# Patient Record
Sex: Female | Born: 1970 | Hispanic: No | Marital: Married | Smoking: Former smoker
Health system: Southern US, Community
[De-identification: ages and names within clinical notes are randomized; demographics above are authoritative.]

## PROBLEM LIST (undated history)

## (undated) DIAGNOSIS — E559 Vitamin D deficiency, unspecified: Secondary | ICD-10-CM

## (undated) DIAGNOSIS — E079 Disorder of thyroid, unspecified: Secondary | ICD-10-CM

## (undated) DIAGNOSIS — I10 Essential (primary) hypertension: Secondary | ICD-10-CM

## (undated) DIAGNOSIS — R7303 Prediabetes: Secondary | ICD-10-CM

## (undated) DIAGNOSIS — M1990 Unspecified osteoarthritis, unspecified site: Secondary | ICD-10-CM

## (undated) DIAGNOSIS — E7800 Pure hypercholesterolemia, unspecified: Secondary | ICD-10-CM

## (undated) DIAGNOSIS — M25569 Pain in unspecified knee: Secondary | ICD-10-CM

## (undated) DIAGNOSIS — E063 Autoimmune thyroiditis: Secondary | ICD-10-CM

## (undated) DIAGNOSIS — M2550 Pain in unspecified joint: Secondary | ICD-10-CM

## (undated) DIAGNOSIS — K589 Irritable bowel syndrome without diarrhea: Secondary | ICD-10-CM

## (undated) DIAGNOSIS — E538 Deficiency of other specified B group vitamins: Secondary | ICD-10-CM

## (undated) DIAGNOSIS — K5900 Constipation, unspecified: Secondary | ICD-10-CM

## (undated) DIAGNOSIS — T7840XA Allergy, unspecified, initial encounter: Secondary | ICD-10-CM

## (undated) DIAGNOSIS — F419 Anxiety disorder, unspecified: Secondary | ICD-10-CM

## (undated) HISTORY — DX: Anxiety disorder, unspecified: F41.9

## (undated) HISTORY — DX: Pain in unspecified joint: M25.50

## (undated) HISTORY — DX: Disorder of thyroid, unspecified: E07.9

## (undated) HISTORY — DX: Autoimmune thyroiditis: E06.3

## (undated) HISTORY — DX: Deficiency of other specified B group vitamins: E53.8

## (undated) HISTORY — DX: Allergy, unspecified, initial encounter: T78.40XA

## (undated) HISTORY — DX: Irritable bowel syndrome, unspecified: K58.9

## (undated) HISTORY — DX: Prediabetes: R73.03

## (undated) HISTORY — DX: Constipation, unspecified: K59.00

## (undated) HISTORY — DX: Pure hypercholesterolemia, unspecified: E78.00

## (undated) HISTORY — DX: Essential (primary) hypertension: I10

## (undated) HISTORY — DX: Pain in unspecified knee: M25.569

## (undated) HISTORY — PX: WRIST SURGERY: SHX841

## (undated) HISTORY — DX: Vitamin D deficiency, unspecified: E55.9

## (undated) HISTORY — DX: Unspecified osteoarthritis, unspecified site: M19.90

---

## 1998-07-19 ENCOUNTER — Other Ambulatory Visit: Admission: RE | Admit: 1998-07-19 | Discharge: 1998-07-19 | Payer: Self-pay | Admitting: *Deleted

## 1999-07-14 ENCOUNTER — Other Ambulatory Visit: Admission: RE | Admit: 1999-07-14 | Discharge: 1999-07-14 | Payer: Self-pay | Admitting: Obstetrics and Gynecology

## 2000-02-07 ENCOUNTER — Inpatient Hospital Stay (HOSPITAL_COMMUNITY): Admission: AD | Admit: 2000-02-07 | Discharge: 2000-02-10 | Payer: Self-pay | Admitting: *Deleted

## 2000-03-10 ENCOUNTER — Other Ambulatory Visit: Admission: RE | Admit: 2000-03-10 | Discharge: 2000-03-10 | Payer: Self-pay | Admitting: Obstetrics and Gynecology

## 2001-03-11 ENCOUNTER — Other Ambulatory Visit: Admission: RE | Admit: 2001-03-11 | Discharge: 2001-03-11 | Payer: Self-pay | Admitting: Obstetrics and Gynecology

## 2002-02-17 ENCOUNTER — Encounter: Admission: RE | Admit: 2002-02-17 | Discharge: 2002-02-17 | Payer: Self-pay | Admitting: Obstetrics and Gynecology

## 2002-02-17 ENCOUNTER — Encounter: Payer: Self-pay | Admitting: Obstetrics and Gynecology

## 2003-05-11 ENCOUNTER — Other Ambulatory Visit: Admission: RE | Admit: 2003-05-11 | Discharge: 2003-05-11 | Payer: Self-pay | Admitting: Obstetrics and Gynecology

## 2005-02-10 ENCOUNTER — Other Ambulatory Visit: Admission: RE | Admit: 2005-02-10 | Discharge: 2005-02-10 | Payer: Self-pay | Admitting: Obstetrics and Gynecology

## 2005-10-22 ENCOUNTER — Inpatient Hospital Stay (HOSPITAL_COMMUNITY): Admission: AD | Admit: 2005-10-22 | Discharge: 2005-10-25 | Payer: Self-pay | Admitting: Obstetrics and Gynecology

## 2006-04-22 ENCOUNTER — Ambulatory Visit: Payer: Self-pay | Admitting: Family Medicine

## 2006-04-22 LAB — CONVERTED CEMR LAB: TSH: 1.74 microintl units/mL (ref 0.35–5.50)

## 2006-06-04 ENCOUNTER — Ambulatory Visit: Payer: Self-pay | Admitting: Family Medicine

## 2006-06-18 ENCOUNTER — Ambulatory Visit: Payer: Self-pay | Admitting: Family Medicine

## 2006-08-22 DIAGNOSIS — E039 Hypothyroidism, unspecified: Secondary | ICD-10-CM | POA: Insufficient documentation

## 2006-09-09 ENCOUNTER — Ambulatory Visit: Payer: Self-pay | Admitting: Family Medicine

## 2006-10-08 ENCOUNTER — Ambulatory Visit: Payer: Self-pay | Admitting: Internal Medicine

## 2006-11-23 ENCOUNTER — Ambulatory Visit: Payer: Self-pay | Admitting: Internal Medicine

## 2006-11-26 ENCOUNTER — Ambulatory Visit: Payer: Self-pay | Admitting: Internal Medicine

## 2006-12-20 ENCOUNTER — Telehealth (INDEPENDENT_AMBULATORY_CARE_PROVIDER_SITE_OTHER): Payer: Self-pay | Admitting: *Deleted

## 2006-12-21 ENCOUNTER — Ambulatory Visit: Payer: Self-pay | Admitting: Internal Medicine

## 2006-12-22 LAB — CONVERTED CEMR LAB: TSH: 1.14 microintl units/mL (ref 0.35–5.50)

## 2006-12-23 ENCOUNTER — Telehealth: Payer: Self-pay | Admitting: Family Medicine

## 2007-03-28 ENCOUNTER — Ambulatory Visit: Payer: Self-pay | Admitting: Family Medicine

## 2007-05-11 ENCOUNTER — Telehealth (INDEPENDENT_AMBULATORY_CARE_PROVIDER_SITE_OTHER): Payer: Self-pay | Admitting: *Deleted

## 2007-10-06 ENCOUNTER — Telehealth (INDEPENDENT_AMBULATORY_CARE_PROVIDER_SITE_OTHER): Payer: Self-pay | Admitting: *Deleted

## 2007-11-18 ENCOUNTER — Telehealth (INDEPENDENT_AMBULATORY_CARE_PROVIDER_SITE_OTHER): Payer: Self-pay | Admitting: *Deleted

## 2008-07-12 ENCOUNTER — Other Ambulatory Visit: Admission: RE | Admit: 2008-07-12 | Discharge: 2008-07-12 | Payer: Self-pay | Admitting: Family Medicine

## 2010-07-27 ENCOUNTER — Encounter: Payer: Self-pay | Admitting: Family Medicine

## 2010-09-10 ENCOUNTER — Encounter: Payer: Self-pay | Admitting: Family Medicine

## 2010-09-18 ENCOUNTER — Other Ambulatory Visit: Payer: Self-pay | Admitting: Family Medicine

## 2010-09-18 ENCOUNTER — Encounter: Payer: Self-pay | Admitting: Family Medicine

## 2010-09-18 ENCOUNTER — Ambulatory Visit (INDEPENDENT_AMBULATORY_CARE_PROVIDER_SITE_OTHER): Payer: Managed Care, Other (non HMO) | Admitting: Family Medicine

## 2010-09-18 DIAGNOSIS — Z0000 Encounter for general adult medical examination without abnormal findings: Secondary | ICD-10-CM

## 2010-09-18 DIAGNOSIS — M25569 Pain in unspecified knee: Secondary | ICD-10-CM

## 2010-09-18 DIAGNOSIS — E785 Hyperlipidemia, unspecified: Secondary | ICD-10-CM

## 2010-09-18 DIAGNOSIS — E039 Hypothyroidism, unspecified: Secondary | ICD-10-CM

## 2010-09-18 LAB — CBC WITH DIFFERENTIAL/PLATELET
Basophils Absolute: 0 10*3/uL (ref 0.0–0.1)
Eosinophils Absolute: 0 10*3/uL (ref 0.0–0.7)
Eosinophils Relative: 1 % (ref 0.0–5.0)
HCT: 41.2 % (ref 36.0–46.0)
Hemoglobin: 14.1 g/dL (ref 12.0–15.0)
Lymphocytes Relative: 37.1 % (ref 12.0–46.0)
Lymphs Abs: 1.5 10*3/uL (ref 0.7–4.0)
MCHC: 34.1 g/dL (ref 30.0–36.0)
MCV: 96.6 fl (ref 78.0–100.0)
Monocytes Absolute: 0.4 10*3/uL (ref 0.1–1.0)
Neutro Abs: 2.1 10*3/uL (ref 1.4–7.7)
Neutrophils Relative %: 51.8 % (ref 43.0–77.0)
Platelets: 229 10*3/uL (ref 150.0–400.0)
RBC: 4.26 Mil/uL (ref 3.87–5.11)
RDW: 13 % (ref 11.5–14.6)

## 2010-09-18 LAB — BASIC METABOLIC PANEL
BUN: 11 mg/dL (ref 6–23)
CO2: 28 mEq/L (ref 19–32)
Calcium: 9.7 mg/dL (ref 8.4–10.5)
Chloride: 105 mEq/L (ref 96–112)
Creatinine, Ser: 0.7 mg/dL (ref 0.4–1.2)
GFR: 99.32 mL/min (ref >60.00)
Glucose, Bld: 75 mg/dL (ref 70–99)
Potassium: 4.7 mEq/L (ref 3.5–5.1)

## 2010-09-18 LAB — LDL CHOLESTEROL, DIRECT: Direct LDL: 137.8 mg/dL

## 2010-09-18 LAB — LIPID PANEL
Cholesterol: 220 mg/dL — ABNORMAL HIGH (ref 0–200)
Triglycerides: 54 mg/dL (ref 0.0–149.0)
VLDL: 10.8 mg/dL (ref 0.0–40.0)

## 2010-09-18 LAB — HEPATIC FUNCTION PANEL
AST: 20 U/L (ref 0–37)
Albumin: 4.4 g/dL (ref 3.5–5.2)
Total Bilirubin: 0.7 mg/dL (ref 0.3–1.2)

## 2010-09-18 LAB — TSH: TSH: 2.4 u[IU]/mL (ref 0.35–5.50)

## 2010-09-19 LAB — CONVERTED CEMR LAB: Vit D, 25-Hydroxy: 32 ng/mL (ref 30–89)

## 2010-09-23 ENCOUNTER — Ambulatory Visit: Payer: Managed Care, Other (non HMO) | Admitting: Family Medicine

## 2010-10-02 NOTE — Assessment & Plan Note (Signed)
Summary: re-establish/cbs   Vital Signs:  Patient profile:   40 year old female Weight:      164.13 pounds (74.60 kg) Temp:     98.5 degrees F (36.94 degrees C) oral BP sitting:   120 / 80  (left arm) Cuff size:   regular  Vitals Entered By: Lucious Groves CMA (September 18, 2010 10:22 AM) CC: Re-est care, wants CPX and pap./kb Is Patient Diabetic? No Pain Assessment Patient in pain? no      Comments Patient notes that she thinks her thyroid med needs adjusting.   History of Present Illness: 40 yo woman here today to re-establish care.  Previous MD- Tenny Craw [facility]).  hypothyroid- on Synthroid daily.  dx'd at age 89.  having some hair loss, increased fatigue, dry skin.  denies cold intolerance.  R knee pain- sxs started 8 months ago.  increased pain w/ stairs and exercise.  pain is in center of knee under knee cap.  no swelling.  no pain w/ regular walking or daily activities.  has not tried tylenol or ibuprofen.  health maintainence- overdue on pap.  Preventive Screening-Counseling & Management  Alcohol-Tobacco     Alcohol drinks/day: <1     Smoking Status: quit     Year Quit: 2009  Caffeine-Diet-Exercise     Does Patient Exercise: yes     Type of exercise: zumba      Sexual History:  currently monogamous.        Drug Use:  never.    Current Medications (verified): 1)  Synthroid 50 Mcg Tabs (Levothyroxine Sodium) .... Take 1 Tablet By Mouth Once A Day 2)  Microgestin Fe 1/20 1-20 Mg-Mcg Tabs (Norethin Ace-Eth Estrad-Fe) .... Once Daily  Allergies (verified): No Known Drug Allergies  Family History: Maternal GM- Ovarian CA Hyperlipidemia- mom DM- mom, grandfather HTN- mom  Social History: married 2 daughters (01, 89) works as Estate agent Status:  quit Does Patient Exercise:  yes Sexual History:  currently monogamous Drug Use:  never  Review of Systems      See HPI  Physical Exam  General:  Well-developed,well-nourished,in no acute  distress; alert,appropriate and cooperative throughout examination Head:  Normocephalic and atraumatic without obvious abnormalities. No apparent alopecia or balding. Eyes:  PERRL, EOMI Neck:  No deformities, masses, or tenderness noted. Lungs:  Normal respiratory effort, chest expands symmetrically. Lungs are clear to auscultation, no crackles or wheezes. Heart:  Normal rate and regular rhythm. S1 and S2 normal without gallop, murmur, click, rub or other extra sounds. Msk:  full flexion and extension of R knee, (-) patellar grind, no pain w/ internal or external rotation of knee Pulses:  +2 carotid, radial, DP Extremities:  No clubbing, cyanosis, edema, or deformity noted with normal full range of motion of all joints.   Neurologic:  alert & oriented X3, cranial nerves II-XII intact, strength normal in all extremities, sensation intact to light touch, gait normal, and DTRs symmetrical and normal.     Impression & Recommendations:  Problem # 1:  HYPOTHYROIDISM (ICD-244.9) due for labs.  adjust as needed. Her updated medication list for this problem includes:    Synthroid 50 Mcg Tabs (Levothyroxine sodium) .Marland Kitchen... Take 1 tablet by mouth once a day  Orders: Venipuncture (46962) Specimen Handling (95284) TLB-TSH (Thyroid Stimulating Hormone) (84443-TSH)  Problem # 2:  KNEE PAIN (XLK-440.10) Assessment: New suspect patellofemoral syndrome.  refer to sports med. Orders: Sports Medicine (Sports Med)  Problem # 3:  PHYSICAL EXAMINATION (ICD-V70.0) check labs  for upcoming physical. Orders: T-Vitamin D (25-Hydroxy) ([id number]) Specimen Handling (16010) TLB-Lipid Panel (80061-LIPID) TLB-BMP (Basic Metabolic Panel-BMET) (80048-METABOL) TLB-CBC Platelet - w/Differential (85025-CBCD) TLB-Hepatic/Liver Function Pnl (80076-HEPATIC)  Complete Medication List: 1)  Synthroid 50 Mcg Tabs (Levothyroxine sodium) .... Take 1 tablet by mouth once a day 2)  Microgestin Fe 1/20 1-20 Mg-mcg Tabs  (Norethin ace-eth estrad-fe) .... Once daily  Patient Instructions: 1)  Schedule your complete physical at your convenience- you can eat before this appt 2)  We'll notify you of your Sports Med appt 3)  We'll notify you of your lab results and adjust the thyroid med as needed 4)  Call with any questions or concerns 5)  Welcome!  We're glad to have you!   Orders Added: 1)  Venipuncture [36415] 2)  T-Vitamin D (25-Hydroxy) [phone] 3)  Specimen Handling [99000] 4)  Sports Medicine [Sports Med] 5)  TLB-TSH (Thyroid Stimulating Hormone) [84443-TSH] 6)  TLB-Lipid Panel [80061-LIPID] 7)  TLB-BMP (Basic Metabolic Panel-BMET) [80048-METABOL] 8)  TLB-CBC Platelet - w/Differential [85025-CBCD] 9)  TLB-Hepatic/Liver Function Pnl [80076-HEPATIC] 10)  New Patient Level II [02542]

## 2010-12-10 ENCOUNTER — Telehealth: Payer: Self-pay | Admitting: Family Medicine

## 2010-12-10 NOTE — Telephone Encounter (Signed)
Uses [facility]--[address], [place]

## 2010-12-10 NOTE — Telephone Encounter (Signed)
Left message to call office

## 2010-12-10 NOTE — Telephone Encounter (Signed)
She can try Auralgan for ear pain.  If she had a cold last week it is most likely due to nasal congestion- can use Sudafed for 3-5 days.  If no improvement needs OV

## 2010-12-12 NOTE — Telephone Encounter (Signed)
Discuss with patient  

## 2011-01-15 ENCOUNTER — Telehealth: Payer: Self-pay

## 2011-01-15 MED ORDER — NORGESTIMATE-ETH ESTRADIOL 0.25-35 MG-MCG PO TABS: 1.0000 | ORAL_TABLET | Freq: Every day | ORAL | Status: DC

## 2011-01-15 NOTE — Telephone Encounter (Signed)
Pt notified and rx sent  °

## 2011-01-15 NOTE — Telephone Encounter (Signed)
Call from patient and she stated that she was switched from Junel to Microgestin 1/20 and she has been having 2 periods for the last 2 months, It is not the normal break through bleeding but an actual cycle. She stated she had to switch because her insurance no longer pays for birth control and she has to get a cheap generic. She stated she did not want to continue to have the multiple cycles every month and wanted to know what you can do to help her c/b#  [id number]

## 2011-01-15 NOTE — Telephone Encounter (Signed)
Can switch to Sprintec (this is $9 at [employer]) and generic on insurance

## 2011-02-20 ENCOUNTER — Other Ambulatory Visit: Payer: Self-pay | Admitting: Family Medicine

## 2011-02-20 MED ORDER — NORETHINDRONE ACET-ETHINYL EST 1-20 MG-MCG PO TABS: 1.0000 | ORAL_TABLET | Freq: Every day | ORAL | Status: DC

## 2011-02-20 NOTE — Telephone Encounter (Signed)
Pt called says that spotting issues have resolved and would like to go back on microgestin. Informed pt rx to be sent to pharmacy

## 2011-03-16 ENCOUNTER — Other Ambulatory Visit: Payer: Self-pay | Admitting: Family Medicine

## 2011-03-16 MED ORDER — LEVOTHYROXINE SODIUM 50 MCG PO TABS: 50.0000 ug | ORAL_TABLET | Freq: Every day | ORAL | Status: DC

## 2011-03-16 NOTE — Telephone Encounter (Signed)
rx sent to pharmacy

## 2011-06-12 ENCOUNTER — Encounter: Payer: Self-pay | Admitting: Family

## 2011-06-12 ENCOUNTER — Ambulatory Visit (INDEPENDENT_AMBULATORY_CARE_PROVIDER_SITE_OTHER): Payer: BC Managed Care – PPO | Admitting: Family

## 2011-06-12 DIAGNOSIS — J329 Chronic sinusitis, unspecified: Secondary | ICD-10-CM

## 2011-06-12 MED ORDER — AMOXICILLIN-POT CLAVULANATE 875-125 MG PO TABS: 1.0000 | ORAL_TABLET | Freq: Two times a day (BID) | ORAL | Status: AC

## 2011-06-12 NOTE — Patient Instructions (Signed)

## 2011-06-12 NOTE — Progress Notes (Signed)
  Subjective:    Patient ID: LAUNA GOEDKEN, female    DOB: 09/15/1970, 40 y.o.   MRN: [id number]  HPI  Ms.  Keeven is a 40 yr old female who presents with chief complaint of sinus congestion.  Had URI the week before thanksgiving.  Never resolved.  Teeth are throbbing/sinus pressure.  She has yellow/green nasal discharge. She has tried mucinex and afrin without improvement.    Review of Systems    see HPI  Past Medical History  Diagnosis Date  . Thyroid disease     History   Social History  . Marital Status: Married    Spouse Name: N/A    Number of Children: N/A  . Years of Education: N/A   Occupational History  . Not on file.   Social History Main Topics  . Smoking status: Former Smoker -- 25 years    Types: Cigarettes    Quit date: 07/06/2005  . Smokeless tobacco: Never Used  . Alcohol Use: Not on file  . Drug Use: Not on file  . Sexually Active: Not on file   Other Topics Concern  . Not on file   Social History Narrative  . No narrative on file    No past surgical history on file.  No family history on file.  No Known Allergies  Current Outpatient Prescriptions on File Prior to Visit  Medication Sig Dispense Refill  . levothyroxine (SYNTHROID) 50 MCG tablet Take 1 tablet (50 mcg total) by mouth daily. Brand name. Pt needs to schedule an ov.  30 tablet  6  . norethindrone-ethinyl estradiol (MICROGESTIN,JUNEL,LOESTRIN) 1-20 MG-MCG tablet Take 1 tablet by mouth daily.  1 Package  6    BP 118/78  Pulse 78  Temp(Src) 99 F (37.2 C) (Oral)  Resp 16  Wt 172 lb (78.019 kg)  SpO2 99%    Objective:   Physical Exam  Constitutional: She appears well-developed and well-nourished.  HENT:  Head: Normocephalic and atraumatic.       Slight yellow tinged fluid noted behind bilateral TM's without bulging or erythema.    + frontal and maxillary sinus tenderness to palpation  Eyes: No scleral icterus.  Neck: Normal range of motion. Neck supple. No  thyromegaly present.  Cardiovascular: Normal rate and regular rhythm.   No murmur heard. Pulmonary/Chest: Effort normal and breath sounds normal. No respiratory distress. She has no wheezes. She has no rales. She exhibits no tenderness.          Assessment & Plan:

## 2011-06-12 NOTE — Assessment & Plan Note (Addendum)
Treat with augmentin.  Reminded pt to use back protection while on augmentin and for 2 weeks after due to interaction with OCP. She is instructed to call if her symptoms worsen or if no improvement in 2-3 days.

## 2011-09-23 ENCOUNTER — Other Ambulatory Visit: Payer: Self-pay | Admitting: Family Medicine

## 2011-09-23 NOTE — Telephone Encounter (Signed)
Refill: Microgestin 1/20 tabs 21 green. Take 1 tablet by mouth daily. Qty 21. Last fill 08-08-11

## 2011-09-24 MED ORDER — NORETHINDRONE ACET-ETHINYL EST 1-20 MG-MCG PO TABS: 1.0000 | ORAL_TABLET | Freq: Every day | ORAL | Status: DC

## 2011-09-24 NOTE — Telephone Encounter (Signed)
Called pt to clarify if she wants the Microgestin per noted in chart on 01-2011 that pt had called in requesting a medication change per noted spotting with the Micorgestin, pt advised she does want the Microgestin sent to [facility] on [address] per the spotting had stopped and she had decided to continue with this medication, sent via escribe for #30 with one refill per pt notes to have upcoming apt 10-2011

## 2011-10-13 ENCOUNTER — Ambulatory Visit (INDEPENDENT_AMBULATORY_CARE_PROVIDER_SITE_OTHER): Payer: BC Managed Care – PPO | Admitting: Family Medicine

## 2011-10-13 ENCOUNTER — Other Ambulatory Visit (HOSPITAL_COMMUNITY)
Admission: RE | Admit: 2011-10-13 | Discharge: 2011-10-13 | Disposition: A | Discharge status: Home / Self Care | Payer: BC Managed Care – PPO | Source: Ambulatory Visit | Attending: Family Medicine | Admitting: Family Medicine

## 2011-10-13 ENCOUNTER — Encounter: Payer: Self-pay | Admitting: Family Medicine

## 2011-10-13 VITALS — BP 116/80 | HR 88 | Temp 98.5°F | Ht 67.25 in | Wt 171.0 lb

## 2011-10-13 DIAGNOSIS — Z01419 Encounter for gynecological examination (general) (routine) without abnormal findings: Secondary | ICD-10-CM | POA: Insufficient documentation

## 2011-10-13 DIAGNOSIS — Z0000 Encounter for general adult medical examination without abnormal findings: Secondary | ICD-10-CM | POA: Insufficient documentation

## 2011-10-13 DIAGNOSIS — E039 Hypothyroidism, unspecified: Secondary | ICD-10-CM

## 2011-10-13 DIAGNOSIS — Z124 Encounter for screening for malignant neoplasm of cervix: Secondary | ICD-10-CM | POA: Insufficient documentation

## 2011-10-13 LAB — HEPATIC FUNCTION PANEL
ALT: 14 U/L (ref 0–35)
AST: 16 U/L (ref 0–37)
Alkaline Phosphatase: 59 U/L (ref 39–117)
Bilirubin, Direct: 0 mg/dL (ref 0.0–0.3)
Total Protein: 7.7 g/dL (ref 6.0–8.3)

## 2011-10-13 LAB — CBC WITH DIFFERENTIAL/PLATELET
Basophils Absolute: 0 10*3/uL (ref 0.0–0.1)
Hemoglobin: 13.4 g/dL (ref 12.0–15.0)
Lymphocytes Relative: 35.4 % (ref 12.0–46.0)
Monocytes Relative: 9.3 % (ref 3.0–12.0)
Neutro Abs: 1.8 10*3/uL (ref 1.4–7.7)
Neutrophils Relative %: 50.4 % (ref 43.0–77.0)
RBC: 4.13 Mil/uL (ref 3.87–5.11)
RDW: 13 % (ref 11.5–14.6)

## 2011-10-13 LAB — LIPID PANEL
Cholesterol: 195 mg/dL (ref 0–200)
LDL Cholesterol: 121 mg/dL — ABNORMAL HIGH (ref 0–99)
Total CHOL/HDL Ratio: 3

## 2011-10-13 LAB — BASIC METABOLIC PANEL
BUN: 12 mg/dL (ref 6–23)
CO2: 25 mEq/L (ref 19–32)
Calcium: 8.9 mg/dL (ref 8.4–10.5)
Chloride: 105 mEq/L (ref 96–112)
Creatinine, Ser: 0.7 mg/dL (ref 0.4–1.2)
GFR: 95.61 mL/min (ref >60.00)
Glucose, Bld: 85 mg/dL (ref 70–99)
Potassium: 3.8 mEq/L (ref 3.5–5.1)
Sodium: 139 mEq/L (ref 135–145)

## 2011-10-13 NOTE — Patient Instructions (Signed)
You look great!  Keep up the good work! We'll notify you of your lab results Call with any questions or concerns Happy Spring! 

## 2011-10-13 NOTE — Progress Notes (Signed)
  Subjective:    Patient ID: Stephanie Lynch, female    DOB: 03/05/1971, 41 y.o.   MRN: [id number]  HPI CPE- due for pap, labs.   Review of Systems Patient reports no vision/ hearing changes, adenopathy, fever,  persistant/recurrent hoarseness , swallowing issues, chest pain, palpitations, edema, persistant/recurrent cough, hemoptysis, dyspnea (rest/exertional/paroxysmal nocturnal), gastrointestinal bleeding (melena, rectal bleeding), abdominal pain, significant heartburn, GU symptoms (dysuria, hematuria, incontinence), Gyn symptoms (abnormal  bleeding, pain),  syncope, focal weakness, memory loss, numbness & tingling, skin/hair/nail changes, abnormal bruising or bleeding, anxiety, or depression.   Gradual weight gain, constipation.    Objective:   Physical Exam  General Appearance:    Alert, cooperative, no distress, appears stated age  Head:    Normocephalic, without obvious abnormality, atraumatic  Eyes:    PERRL, conjunctiva/corneas clear, EOM's intact, fundi    benign, both eyes  Ears:    Normal TM's and external ear canals, both ears  Nose:   Nares normal, septum midline, mucosa normal, no drainage    or sinus tenderness  Throat:   Lips, mucosa, and tongue normal; teeth and gums normal  Neck:   Supple, symmetrical, trachea midline, no adenopathy;    Thyroid: no enlargement/tenderness/nodules  Back:     Symmetric, no curvature, ROM normal, no CVA tenderness  Lungs:     Clear to auscultation bilaterally, respirations unlabored  Chest Wall:    No tenderness or deformity   Heart:    Regular rate and rhythm, S1 and S2 normal, no murmur, rub   or gallop  Breast Exam:    No tenderness, masses, or nipple abnormality  Abdomen:     Soft, non-tender, bowel sounds active all four quadrants,    no masses, no organomegaly  Genitalia:    External genitalia normal, cervix normal in appearance, no CMT, uterus in normal size and position, adnexa w/out mass or tenderness, mucosa pink and moist,  no lesions or discharge present  Rectal:    Normal external appearance  Extremities:   Extremities normal, atraumatic, no cyanosis or edema  Pulses:   2+ and symmetric all extremities  Skin:   Skin color, texture, turgor normal, no rashes or lesions  Lymph nodes:   Cervical, supraclavicular, and axillary nodes normal  Neurologic:   CNII-XII intact, normal strength, sensation and reflexes    throughout          Assessment & Plan:

## 2011-10-14 ENCOUNTER — Encounter: Payer: Self-pay | Admitting: *Deleted

## 2011-10-17 LAB — VITAMIN D 1,25 DIHYDROXY
Vitamin D 1, 25 (OH)2 Total: 72 pg/mL (ref 18–72)
Vitamin D2 1, 25 (OH)2: <8 pg/mL
Vitamin D3 1, 25 (OH)2: 72 pg/mL

## 2011-10-18 NOTE — Assessment & Plan Note (Signed)
Pap collected. 

## 2011-10-18 NOTE — Assessment & Plan Note (Signed)
Due for labs.  Adjust meds prn. 

## 2011-10-18 NOTE — Assessment & Plan Note (Signed)
Pt's PE WNL.  Start mammo next year.  Check labs.  Anticipatory guidance provided.

## 2011-10-19 ENCOUNTER — Encounter: Payer: Self-pay | Admitting: *Deleted

## 2011-11-23 ENCOUNTER — Telehealth: Payer: Self-pay | Admitting: Family Medicine

## 2011-11-23 MED ORDER — NORETHINDRONE ACET-ETHINYL EST 1-20 MG-MCG PO TABS: 1.0000 | ORAL_TABLET | Freq: Every day | ORAL | Status: DC

## 2011-11-23 NOTE — Telephone Encounter (Signed)
Refill: Microgestin 1/20 tabs. Take 1 tablet by mouth daily. Qty 21. Last fill 10-24-11

## 2011-11-23 NOTE — Telephone Encounter (Signed)
Rx sent 

## 2012-01-20 ENCOUNTER — Telehealth: Payer: Self-pay | Admitting: *Deleted

## 2012-01-20 ENCOUNTER — Other Ambulatory Visit: Payer: Self-pay | Admitting: Family Medicine

## 2012-01-20 MED ORDER — LEVOTHYROXINE SODIUM 50 MCG PO TABS: 50.0000 ug | ORAL_TABLET | Freq: Every day | ORAL | Status: DC

## 2012-01-20 NOTE — Telephone Encounter (Signed)
Refill done,  

## 2012-01-20 NOTE — Telephone Encounter (Signed)
Received call from emily with [facility] advising the refill sent today noted pt needs to have brand name, however pt has been receiving the generic since 2011, noted on 03-2011 this office sent the same SIG: PT to have [facility] name, pharmacy rep noted they did not see that direction in sept 2012 and gave pt the generic, MD Tabori reviewed labs for 4-13 and pt labs noted good, gave verbal order to have pt continue with generic and change order in the chart, removed the sig for [facility] name and noted as phone in order

## 2012-01-20 NOTE — Telephone Encounter (Signed)
LEVONTHYROXINE 0.05 MG (50 MCG) TAB QTY: 30 REFILL: 12/15/11 TAKE 1 TABLET BY MOUTH ONCE DAILY

## 2012-01-21 ENCOUNTER — Telehealth: Payer: Self-pay | Admitting: Family Medicine

## 2012-01-21 NOTE — Telephone Encounter (Signed)
Pt states she does not understand why she has no refills left on her levothyroxine. She states she was here a few months ago for her physical and labs and that Dr. Beverely Low usually gives her at least 6 months of refills. Pt has just picked up her 30 day rx, but would like [place] to call in some refills to [facility] on [address]

## 2012-01-22 MED ORDER — LEVOTHYROXINE SODIUM 50 MCG PO TABS: 50.0000 ug | ORAL_TABLET | Freq: Every day | ORAL | Status: DC

## 2012-01-22 NOTE — Telephone Encounter (Signed)
Noted phone call with pharmacy on 01-20-12 per the pharmacy did not realize in September 2012 that the sig noted DAW not generic and the lastest refill was noted to have the same instructions as DAW AGAIN, however pharmacy called to verfiy and MD Tabori noted pt recent labs were normal ok to continue on the generic synthroid per has been working well for her thus far, this nurse gave verbal order to pharmacist to fill #30 with 5 refills, via escribe today to the pharmacy. MD Beverely Low made aware verbally

## 2012-02-08 ENCOUNTER — Telehealth: Payer: Self-pay | Admitting: Family Medicine

## 2012-02-08 NOTE — Telephone Encounter (Signed)
Pt to call back on 02-09-12 if sxs have not improved.

## 2012-02-08 NOTE — Telephone Encounter (Signed)
Caller: Briane/Patient; PCP: Sheliah Hatch.; CB#: [phone]; ; ; Call regarding Eye Redness & Discharge; Onset 02/07/12; Patient reports the left sclera is reddened and has yellow drainage. Patient reports eyelashes were stuck together this morning 02/08/12 when she woke up. Patient has used Visine to help the redness and warm compresses to help the drainage. Afebrile. Patient reports "gritty" sensation in the eye today. Emergent symptoms of "New onset of eye redness, irritation/foreign body sensation or gritty feeling with watery or sticky mucus drainage" positive per Eye: Infection or Irritation guideline. Home care advice given. Patient not able to come in for an appointment today 02/08/12. Instructed patient to call back on 02/09/12 to schedule an appointment if symptoms not improving.

## 2012-02-10 NOTE — Telephone Encounter (Signed)
.  left message to have patient return my call if she needs further assistance per her sxs

## 2012-02-19 ENCOUNTER — Encounter: Payer: Self-pay | Admitting: Family Medicine

## 2012-02-19 ENCOUNTER — Ambulatory Visit (INDEPENDENT_AMBULATORY_CARE_PROVIDER_SITE_OTHER): Payer: BC Managed Care – PPO | Admitting: Family Medicine

## 2012-02-19 VITALS — BP 116/76 | HR 76 | Temp 99.1°F | Wt 174.2 lb

## 2012-02-19 DIAGNOSIS — H5710 Ocular pain, unspecified eye: Secondary | ICD-10-CM

## 2012-02-19 NOTE — Assessment & Plan Note (Signed)
New.  No obvious abnormality on PE but given amount of pain w/ gentle pressure on globe will refer to ophtho for complete evaluation and tx

## 2012-02-19 NOTE — Progress Notes (Signed)
  Subjective:    Patient ID: Stephanie Lynch, female    DOB: [birth year], 41 y.o.   MRN: [id number]  HPI Eye pain/swelling- L eye, 2 weeks of intermittent redness, swelling and drainage.  Hx of sinus problems.  Last red yesterday, was crusted shut this AM.  Not currently on Zyrtec.   Review of Systems     Objective:   Physical Exam  Vitals reviewed. Constitutional: She appears well-developed and well-nourished. No distress.  HENT:       TMs normal bilaterally  Eyes: Lids are normal. Right eye exhibits no discharge. Left eye exhibits no discharge. Right conjunctiva is not injected. Right conjunctiva has no hemorrhage. Left conjunctiva is not injected. Left conjunctiva has no hemorrhage. No scleral icterus. Right eye exhibits normal extraocular motion and no nystagmus. Left eye exhibits normal extraocular motion and no nystagmus. Right pupil is round and reactive. Left pupil is round and reactive. Pupils are equal.       + TTP over L globe w/out obvious abnormality          Assessment & Plan:

## 2012-03-21 ENCOUNTER — Telehealth: Payer: Self-pay

## 2012-03-21 NOTE — Telephone Encounter (Signed)
Caller: Mckinsey/Patient; PCP: Sheliah Hatch.; CB#: [phone]; Call regarding Sinusitis; Afebrile. Onset 2 weeks ago. Patient having clear/yellow nasal drainage and swelling of sinuses; patient also having bilateral ear pain, tooth pain. Treating with Mucinex, Sudafed, Ibuprofen, and warm compresses - nothing is helping. All emergent symptoms ruled out per Upper Respiratory Infection Guideline except for "Symptoms worse after 7 days or symptoms do not improve after 14 days of home care." Advised patient to go to [facility] within next 24 hours for evaluation per triage disposition since office is closed this weekend. Patient agrees to go.  Care Advice: ~ SYMPTOM / CONDITION MANAGEMENT Coughing up mucus or phlegm helps to get rid of an infection. A productive cough should not be stopped. A cough medicine with guaifenesin (Robitussin, Mucinex) can help loosen the mucus. Cough medicine with dextromethorphan (DM) should be avoided. Drinking lots of fluids can help loosen the mucus too, especially warm fluids. ~ Analgesic/Antipyretic Advice - NSAIDs: Consider aspirin, ibuprofen, naproxen or ketoprofen for pain or fever as directed on label or by pharmacist/provider. PRECAUTIONS: - If over 105 years of age, should not take longer than 1 week without consulting provider. EXCEPTIONS: - Should not be used if taking blood thinners or have bleeding problems. - Do not use if have history of sensitivity/allergy to any of these medications; or history of cardiovascular, ulcer, kidney, liver disease or diabetes unless approved by provider. - Do not exceed recommended dose or frequency.

## 2012-03-21 NOTE — Telephone Encounter (Signed)
Left message for pt to call and let us know if she still needs to be seen.

## 2012-03-21 NOTE — Telephone Encounter (Signed)
Noted.  If she did not go to UC, we can see her today

## 2012-03-22 NOTE — Telephone Encounter (Signed)
.  left message to have patient return my call per no call back of [facility] UC OV noted at this time

## 2012-03-24 NOTE — Telephone Encounter (Signed)
.  left message to have patient return my call per still no [facility] or UC visit noted at this time, advised if pt still has any further concerns to please call our office if she needs to be seen, note MD Beverely Low will be out of the office til Wednesday however another MD in the office will be able to assist if apt available at time of call, if not Saturday clinic is also available

## 2012-06-28 ENCOUNTER — Encounter: Payer: Self-pay | Admitting: Family Medicine

## 2012-06-28 ENCOUNTER — Ambulatory Visit (INDEPENDENT_AMBULATORY_CARE_PROVIDER_SITE_OTHER): Payer: BC Managed Care – PPO | Admitting: Family Medicine

## 2012-06-28 VITALS — BP 118/72 | HR 79 | Temp 98.2°F | Wt 174.2 lb

## 2012-06-28 DIAGNOSIS — J329 Chronic sinusitis, unspecified: Secondary | ICD-10-CM

## 2012-06-28 MED ORDER — AMOXICILLIN 875 MG PO TABS: 875.0000 mg | ORAL_TABLET | Freq: Two times a day (BID) | ORAL | Status: DC

## 2012-06-28 NOTE — Assessment & Plan Note (Signed)
Pt's sxs and PE consistent w/ infxn.  Start abx.  Reviewed supportive care and red flags that should prompt return.  Pt expressed understanding and is in agreement w/ plan.  

## 2012-06-28 NOTE — Progress Notes (Signed)
  Subjective:    Patient ID: Stephanie Lynch, female    DOB: [birth year], 41 y.o.   MRN: [id number]  HPI URI- sxs started last week w/ nasal drainage.  Saturday developed fever.  + sinus pressure/pain.  Bilateral ear fullness.  + body aches.  Minimal cough.  + tooth pain.  + sick contacts.  Hx of seasonal allergies and sinus infxns.   Review of Systems For ROS see HPI     Objective:   Physical Exam  Vitals reviewed. Constitutional: She appears well-developed and well-nourished. No distress.  HENT:  Head: Normocephalic and atraumatic.  Right Ear: Tympanic membrane normal.  Left Ear: Tympanic membrane normal.  Nose: Mucosal edema and rhinorrhea present. Right sinus exhibits maxillary sinus tenderness and frontal sinus tenderness. Left sinus exhibits maxillary sinus tenderness and frontal sinus tenderness.  Mouth/Throat: Uvula is midline and mucous membranes are normal. Posterior oropharyngeal erythema present. No oropharyngeal exudate.  Eyes: Conjunctivae normal and EOM are normal. Pupils are equal, round, and reactive to light.  Neck: Normal range of motion. Neck supple.  Cardiovascular: Normal rate, regular rhythm and normal heart sounds.   Pulmonary/Chest: Effort normal and breath sounds normal. No respiratory distress. She has no wheezes.  Lymphadenopathy:    She has no cervical adenopathy.          Assessment & Plan:

## 2012-06-28 NOTE — Patient Instructions (Addendum)
This is a sinus infection Start the Amox twice daily w/ food Drink plenty of fluids Mucinex to thin your congestion REST! Hang in there! Happy Holidays!!!

## 2012-08-20 ENCOUNTER — Other Ambulatory Visit: Payer: Self-pay

## 2012-09-14 ENCOUNTER — Ambulatory Visit (INDEPENDENT_AMBULATORY_CARE_PROVIDER_SITE_OTHER): Payer: BC Managed Care – PPO | Admitting: Family Medicine

## 2012-09-14 ENCOUNTER — Ambulatory Visit: Payer: BC Managed Care – PPO | Admitting: Family Medicine

## 2012-09-14 ENCOUNTER — Encounter: Payer: Self-pay | Admitting: Family Medicine

## 2012-09-14 VITALS — BP 110/68 | HR 85 | Temp 99.3°F | Wt 176.2 lb

## 2012-09-14 DIAGNOSIS — J069 Acute upper respiratory infection, unspecified: Secondary | ICD-10-CM

## 2012-09-14 MED ORDER — LEVOTHYROXINE SODIUM 50 MCG PO TABS: 50.0000 ug | ORAL_TABLET | Freq: Every day | ORAL | Status: DC

## 2012-09-14 NOTE — Patient Instructions (Signed)

## 2012-09-14 NOTE — Progress Notes (Signed)
  Subjective:     Stephanie Lynch is a 42 y.o. female who presents for evaluation of symptoms of a URI. Symptoms include congestion, low grade fever, nasal congestion, post nasal drip and sore throat. Onset of symptoms was 3 days ago, and has been gradually worsening since that time. Treatment to date: none.  The following portions of the patient's history were reviewed and updated as appropriate: allergies, current medications, past family history, past medical history, past social history, past surgical history and problem list.  Review of Systems Pertinent items are noted in HPI.   Objective:    BP 110/68  Pulse 85  Temp(Src) 99.3 F (37.4 C) (Oral)  Wt 176 lb 3.2 oz (79.924 kg)  BMI 27.4 kg/m2  SpO2 98% General appearance: alert, cooperative, appears stated age and no distress Ears: + fluid b/l --tm dull and retracted b/l Nose: clear discharge, mild congestion, turbinates red, swollen, no sinus tenderness Throat: lips, mucosa, and tongue normal; teeth and gums normal Neck: no adenopathy, supple, symmetrical, trachea midline and thyroid not enlarged, symmetric, no tenderness/mass/nodules Lungs: clear to auscultation bilaterally Heart: S1, S2 normal   Assessment:    viral upper respiratory illness   Plan:    Discussed diagnosis and treatment of URI. Suggested symptomatic OTC remedies. Nasal steroids per orders. Follow up as needed. qnsal sample given  Call or leave message on My chart if symptoms worsen

## 2012-09-16 ENCOUNTER — Encounter: Payer: Self-pay | Admitting: Family Medicine

## 2012-09-16 MED ORDER — CEFUROXIME AXETIL 500 MG PO TABS: 500.0000 mg | ORAL_TABLET | Freq: Two times a day (BID) | ORAL | Status: DC

## 2012-10-20 ENCOUNTER — Telehealth: Payer: Self-pay | Admitting: Family Medicine

## 2012-10-20 NOTE — Telephone Encounter (Signed)
Refill: Microgestin 1/20 tabs 21green. Take 1 tablet by mouth daily. Qty 21. Last fill 09-25-12

## 2012-10-24 MED ORDER — NORETHINDRONE ACET-ETHINYL EST 1-20 MG-MCG PO TABS: 1.0000 | ORAL_TABLET | Freq: Every day | ORAL | Status: DC

## 2012-10-24 NOTE — Telephone Encounter (Signed)
Med filled.  

## 2012-10-27 ENCOUNTER — Ambulatory Visit (INDEPENDENT_AMBULATORY_CARE_PROVIDER_SITE_OTHER): Payer: BC Managed Care – PPO | Admitting: Family Medicine

## 2012-10-27 ENCOUNTER — Encounter: Payer: Self-pay | Admitting: Family Medicine

## 2012-10-27 VITALS — BP 100/70 | HR 81 | Temp 99.2°F | Ht 67.25 in | Wt 173.0 lb

## 2012-10-27 DIAGNOSIS — Z01419 Encounter for gynecological examination (general) (routine) without abnormal findings: Secondary | ICD-10-CM

## 2012-10-27 DIAGNOSIS — Z0000 Encounter for general adult medical examination without abnormal findings: Secondary | ICD-10-CM

## 2012-10-27 DIAGNOSIS — Z1231 Encounter for screening mammogram for malignant neoplasm of breast: Secondary | ICD-10-CM

## 2012-10-27 DIAGNOSIS — Z1331 Encounter for screening for depression: Secondary | ICD-10-CM

## 2012-10-27 LAB — HEPATIC FUNCTION PANEL
ALT: 12 U/L (ref 0–35)
Albumin: 4.2 g/dL (ref 3.5–5.2)
Total Bilirubin: 0.6 mg/dL (ref 0.3–1.2)

## 2012-10-27 LAB — CBC WITH DIFFERENTIAL/PLATELET
Basophils Relative: 1.1 % (ref 0.0–3.0)
Eosinophils Absolute: 0 10*3/uL (ref 0.0–0.7)
Eosinophils Relative: 0.6 % (ref 0.0–5.0)
Hemoglobin: 14.3 g/dL (ref 12.0–15.0)
Lymphocytes Relative: 35.5 % (ref 12.0–46.0)
MCHC: 34.2 g/dL (ref 30.0–36.0)
MCV: 95.7 fl (ref 78.0–100.0)
Monocytes Absolute: 0.4 10*3/uL (ref 0.1–1.0)
Neutro Abs: 2.7 10*3/uL (ref 1.4–7.7)
RBC: 4.36 Mil/uL (ref 3.87–5.11)

## 2012-10-27 LAB — BASIC METABOLIC PANEL
BUN: 11 mg/dL (ref 6–23)
Chloride: 101 mEq/L (ref 96–112)
Creatinine, Ser: 0.8 mg/dL (ref 0.4–1.2)
Glucose, Bld: 79 mg/dL (ref 70–99)

## 2012-10-27 LAB — LIPID PANEL
Cholesterol: 183 mg/dL (ref 0–200)
VLDL: 17.4 mg/dL (ref 0.0–40.0)

## 2012-10-27 LAB — TSH: TSH: 1.52 u[IU]/mL (ref 0.35–5.50)

## 2012-10-27 MED ORDER — NORGESTIMATE-ETH ESTRADIOL 0.25-35 MG-MCG PO TABS: 1.0000 | ORAL_TABLET | Freq: Every day | ORAL | Status: DC

## 2012-10-27 NOTE — Progress Notes (Signed)
  Subjective:    Patient ID: Stephanie Lynch, female    DOB: 1970/10/06, 42 y.o.   MRN: [id number]  HPI CPE- UTD on pap (4/13), due for 1st mammo.   Review of Systems Patient reports no vision/ hearing changes, adenopathy,fever, weight change,  persistant/recurrent hoarseness , swallowing issues, chest pain, palpitations, edema, persistant/recurrent cough, hemoptysis, dyspnea (rest/exertional/paroxysmal nocturnal), gastrointestinal bleeding (melena, rectal bleeding), abdominal pain, significant heartburn, bowel changes, GU symptoms (dysuria, hematuria, incontinence), Gyn symptoms (abnormal  bleeding, pain),  syncope, focal weakness, memory loss, numbness & tingling, skin/hair/nail changes, abnormal bruising or bleeding, anxiety, or depression.     Objective:   Physical Exam General Appearance:    Alert, cooperative, no distress, appears stated age  Head:    Normocephalic, without obvious abnormality, atraumatic  Eyes:    PERRL, conjunctiva/corneas clear, EOM's intact, fundi    benign, both eyes  Ears:    Normal TM's and external ear canals, both ears  Nose:   Nares normal, septum midline, mucosa normal, no drainage    or sinus tenderness  Throat:   Lips, mucosa, and tongue normal; teeth and gums normal  Neck:   Supple, symmetrical, trachea midline, no adenopathy;    Thyroid: no enlargement/tenderness/nodules  Back:     Symmetric, no curvature, ROM normal, no CVA tenderness  Lungs:     Clear to auscultation bilaterally, respirations unlabored  Chest Wall:    No tenderness or deformity   Heart:    Regular rate and rhythm, S1 and S2 normal, no murmur, rub   or gallop  Breast Exam:    Deferred to mammo  Abdomen:     Soft, non-tender, bowel sounds active all four quadrants,    no masses, no organomegaly  Genitalia:    Deferred  Rectal:    Extremities:   Extremities normal, atraumatic, no cyanosis or edema  Pulses:   2+ and symmetric all extremities  Skin:   Skin color, texture, turgor  normal, no rashes or lesions  Lymph nodes:   Cervical, supraclavicular, and axillary nodes normal  Neurologic:   CNII-XII intact, normal strength, sensation and reflexes    throughout          Assessment & Plan:

## 2012-10-27 NOTE — Patient Instructions (Addendum)
We'll notify you of your lab results Someone will call you with your mammo appt Try and get regular activity Call with any questions or concerns Happy Spring!!

## 2012-10-29 NOTE — Assessment & Plan Note (Signed)
Pt's PE WNL.  Due for 1st mammo- referral made.  Check labs.  Anticipatory guidance provided.

## 2012-11-01 LAB — VITAMIN D 1,25 DIHYDROXY
Vitamin D 1, 25 (OH)2 Total: 51 pg/mL (ref 18–72)
Vitamin D2 1, 25 (OH)2: <8 pg/mL
Vitamin D3 1, 25 (OH)2: 51 pg/mL

## 2013-03-19 ENCOUNTER — Other Ambulatory Visit: Payer: Self-pay | Admitting: Family Medicine

## 2013-03-21 NOTE — Telephone Encounter (Signed)
Rx filled and sent to pharmacy. SW, CMA 

## 2013-03-28 ENCOUNTER — Ambulatory Visit (INDEPENDENT_AMBULATORY_CARE_PROVIDER_SITE_OTHER): Payer: BC Managed Care – PPO | Admitting: Family Medicine

## 2013-03-28 ENCOUNTER — Encounter: Payer: Self-pay | Admitting: Family Medicine

## 2013-03-28 VITALS — BP 118/76 | HR 71 | Temp 98.4°F | Wt 178.0 lb

## 2013-03-28 DIAGNOSIS — N390 Urinary tract infection, site not specified: Secondary | ICD-10-CM

## 2013-03-28 LAB — POCT URINALYSIS DIPSTICK
Glucose, UA: NEGATIVE
Ketones, UA: NEGATIVE
Leukocytes, UA: NEGATIVE
Protein, UA: NEGATIVE
Urobilinogen, UA: 0.2

## 2013-03-28 MED ORDER — CEPHALEXIN 500 MG PO CAPS: 500.0000 mg | ORAL_CAPSULE | Freq: Two times a day (BID) | ORAL | Status: AC

## 2013-03-28 NOTE — Patient Instructions (Addendum)
Start the Keflex twice daily Drink plenty of fluids Call with any questions or concerns Hang in there! Happy Early [place] Ouch!

## 2013-03-28 NOTE — Progress Notes (Signed)
  Subjective:    Patient ID: Stephanie Lynch, female    DOB: 05-05-1971, 42 y.o.   MRN: [id number]  HPI UTI- increased frequency, urgency.  sxs started 2 weeks ago.  Poor fluid intake, is holding urine.  Sensation of incomplete emptying.  Little dysuria.  No fevers, back pain.  + suprapubic pressure.   Review of Systems For ROS see HPI     Objective:   Physical Exam  Vitals reviewed. Constitutional: She appears well-developed and well-nourished. No distress.  Abdominal: Soft. She exhibits no distension. There is no tenderness (no suprapubic or CVA tenderness).          Assessment & Plan:

## 2013-03-28 NOTE — Assessment & Plan Note (Signed)
New.  Pt's sxs consistent w/ infxn.  UA shows trace blood.  Will send for cx.  Start abx.  Reviewed supportive care and red flags that should prompt return.  Pt expressed understanding and is in agreement w/ plan.

## 2013-03-29 NOTE — Addendum Note (Signed)
Addended by: Silvio Pate D on: 03/29/2013 05:15 PM   Modules accepted: Orders

## 2013-03-31 LAB — URINE CULTURE: Colony Count: 70000

## 2013-05-11 ENCOUNTER — Other Ambulatory Visit: Payer: Self-pay

## 2013-05-17 ENCOUNTER — Telehealth: Payer: Self-pay | Admitting: *Deleted

## 2013-05-17 NOTE — Telephone Encounter (Signed)
Patient called and stated that both of her daughters have head lice and is been treated. Patient would like to know if she should get treated herself and if so what treatment do you recommend? Please advise. SW

## 2013-05-17 NOTE — Telephone Encounter (Signed)
She only needs treatment if she herself develops symptoms

## 2013-05-17 NOTE — Telephone Encounter (Signed)
Patient notified and understood.

## 2013-05-18 ENCOUNTER — Encounter: Payer: Self-pay | Admitting: Family Medicine

## 2013-05-18 ENCOUNTER — Ambulatory Visit (INDEPENDENT_AMBULATORY_CARE_PROVIDER_SITE_OTHER): Payer: BC Managed Care – PPO | Admitting: Family Medicine

## 2013-05-18 VITALS — BP 120/80 | HR 83 | Temp 97.8°F | Resp 16 | Wt 174.5 lb

## 2013-05-18 DIAGNOSIS — F32A Depression, unspecified: Secondary | ICD-10-CM | POA: Insufficient documentation

## 2013-05-18 DIAGNOSIS — F341 Dysthymic disorder: Secondary | ICD-10-CM

## 2013-05-18 DIAGNOSIS — F329 Major depressive disorder, single episode, unspecified: Secondary | ICD-10-CM

## 2013-05-18 DIAGNOSIS — F419 Anxiety disorder, unspecified: Secondary | ICD-10-CM | POA: Insufficient documentation

## 2013-05-18 MED ORDER — ESCITALOPRAM OXALATE 10 MG PO TABS: 10.0000 mg | ORAL_TABLET | Freq: Every day | ORAL | Status: DC

## 2013-05-18 NOTE — Assessment & Plan Note (Signed)
New.  Moderate.  Very anxious and tearful about very minimal issue.  Now agreeable to starting meds.  Start SSRI and follow closely for improvement.

## 2013-05-18 NOTE — Patient Instructions (Signed)
Follow up in 4-6 weeks to recheck mood Start the Lexapro daily Call with any questions or concerns Try and find a stress outlet! You can do this! Happy Thanksgiving!

## 2013-05-18 NOTE — Progress Notes (Signed)
  Subjective:    Patient ID: Stephanie Lynch, female    DOB: 1970/11/10, 42 y.o.   MRN: [id number]  HPI Anxiety- pt's daughters both have lice and 'i'm about to lose my mind'.  Pt has had increased stress for some time and feels that 'this is going to break me'.  Very anxious.  Tearful.  Poor sleep, decreased energy.  Hx of similar but has been resistant to starting meds- 'but maybe I'm there'.  Lice- daughters both treated, 'check my hair'.   Review of Systems For ROS see HPI     Objective:   Physical Exam  Vitals reviewed. Constitutional: She appears well-developed and well-nourished.  HENT:  No obvious lice or nits  Psychiatric:  Anxious, tearful          Assessment & Plan:

## 2013-08-07 ENCOUNTER — Encounter: Payer: Self-pay | Admitting: Family Medicine

## 2013-08-07 ENCOUNTER — Ambulatory Visit (INDEPENDENT_AMBULATORY_CARE_PROVIDER_SITE_OTHER): Payer: BC Managed Care – PPO | Admitting: Family Medicine

## 2013-08-07 VITALS — BP 120/72 | HR 80 | Temp 98.4°F | Resp 16 | Wt 186.2 lb

## 2013-08-07 DIAGNOSIS — J329 Chronic sinusitis, unspecified: Secondary | ICD-10-CM

## 2013-08-07 MED ORDER — AMOXICILLIN 875 MG PO TABS: 875.0000 mg | ORAL_TABLET | Freq: Two times a day (BID) | ORAL | Status: DC

## 2013-08-07 NOTE — Progress Notes (Signed)
Pre visit review using our clinic review tool, if applicable. No additional management support is needed unless otherwise documented below in the visit note. 

## 2013-08-07 NOTE — Patient Instructions (Signed)
Schedule your physical for any time after 4/24 Start the Amoxicillin twice daily Drink plenty of fluids REST! Continue the Mucinex and nasal saline Call with any questions or concerns Hang in there!!!

## 2013-08-07 NOTE — Assessment & Plan Note (Signed)
Pt's sxs and PE consistent w/ infxn.  Start abx.  Reviewed supportive care and red flags that should prompt return.  Pt expressed understanding and is in agreement w/ plan.  

## 2013-08-07 NOTE — Progress Notes (Signed)
   Subjective:    Patient ID: Stephanie Lynch, female    DOB: July 10, 1970, 43 y.o.   MRN: [id number]  HPI URI- sxs started 3-4 days ago.  Has taken Sudafed and used saline w/o relief.  + sinus congestion, tooth and jaw pain, bilateral ear fullness, has sensation of 'being in a tunnel'.  No fevers.  No nausea.  Hx of similar.  Mild cough but due to PND.     Review of Systems For ROS see HPI     Objective:   Physical Exam  Vitals reviewed. Constitutional: She appears well-developed and well-nourished. No distress.  HENT:  Head: Normocephalic and atraumatic.  Right Ear: Tympanic membrane normal.  Left Ear: Tympanic membrane normal.  Nose: Mucosal edema and rhinorrhea present. Right sinus exhibits maxillary sinus tenderness and frontal sinus tenderness. Left sinus exhibits maxillary sinus tenderness and frontal sinus tenderness.  Mouth/Throat: Uvula is midline and mucous membranes are normal. Posterior oropharyngeal erythema present. No oropharyngeal exudate.  Eyes: Conjunctivae and EOM are normal. Pupils are equal, round, and reactive to light.  Neck: Normal range of motion. Neck supple.  Cardiovascular: Normal rate, regular rhythm and normal heart sounds.   Pulmonary/Chest: Effort normal and breath sounds normal. No respiratory distress. She has no wheezes.  Lymphadenopathy:    She has no cervical adenopathy.          Assessment & Plan:

## 2013-08-25 ENCOUNTER — Encounter: Payer: Self-pay | Admitting: Physician Assistant

## 2013-08-25 ENCOUNTER — Ambulatory Visit (INDEPENDENT_AMBULATORY_CARE_PROVIDER_SITE_OTHER): Payer: BC Managed Care – PPO | Admitting: Physician Assistant

## 2013-08-25 VITALS — BP 122/80 | HR 80 | Temp 98.2°F | Resp 16 | Wt 185.0 lb

## 2013-08-25 DIAGNOSIS — J329 Chronic sinusitis, unspecified: Secondary | ICD-10-CM

## 2013-08-25 MED ORDER — METHYLPREDNISOLONE ACETATE 80 MG/ML IJ SUSP
40.0000 mg | Freq: Once | INTRAMUSCULAR | Status: AC
  Administered 2013-08-25: 40 mg via INTRAMUSCULAR

## 2013-08-25 MED ORDER — FLUTICASONE PROPIONATE 50 MCG/ACT NA SUSP: 2.0000 | Freq: Every day | NASAL | Status: DC

## 2013-08-25 MED ORDER — AZITHROMYCIN 250 MG PO TABS: ORAL_TABLET | ORAL | Status: DC

## 2013-08-25 NOTE — Patient Instructions (Signed)
Take antibiotic as prescribed.  Increase fluid intake.  Rest.  Saline nasal spray. Mucinex. Humidifier in bedroom. Use Flonase as directed..  Please call or return to clinic if symptoms are not improving. You will need to consider seeing an ENT for recurrence of symptoms or let us obtain at CT scan of your sinuses.  Sinusitis Sinusitis is redness, soreness, and swelling (inflammation) of the paranasal sinuses. Paranasal sinuses are air pockets within the bones of your face (beneath the eyes, the middle of the forehead, or above the eyes). In healthy paranasal sinuses, mucus is able to drain out, and air is able to circulate through them by way of your nose. However, when your paranasal sinuses are inflamed, mucus and air can become trapped. This can allow bacteria and other germs to grow and cause infection. Sinusitis can develop quickly and last only a short time (acute) or continue over a long period (chronic). Sinusitis that lasts for more than 12 weeks is considered chronic.  CAUSES  Causes of sinusitis include:  Allergies.  Structural abnormalities, such as displacement of the cartilage that separates your nostrils (deviated septum), which can decrease the air flow through your nose and sinuses and affect sinus drainage.  Functional abnormalities, such as when the small hairs (cilia) that line your sinuses and help remove mucus do not work properly or are not present. SYMPTOMS  Symptoms of acute and chronic sinusitis are the same. The primary symptoms are pain and pressure around the affected sinuses. Other symptoms include:  Upper toothache.  Earache.  Headache.  Bad breath.  Decreased sense of smell and taste.  A cough, which worsens when you are lying flat.  Fatigue.  Fever.  Thick drainage from your nose, which often is green and may contain pus (purulent).  Swelling and warmth over the affected sinuses. DIAGNOSIS  Your caregiver will perform a physical exam. During the  exam, your caregiver may:  Look in your nose for signs of abnormal growths in your nostrils (nasal polyps).  Tap over the affected sinus to check for signs of infection.  View the inside of your sinuses (endoscopy) with a special imaging device with a light attached (endoscope), which is inserted into your sinuses. If your caregiver suspects that you have chronic sinusitis, one or more of the following tests may be recommended:  Allergy tests.  Nasal culture A sample of mucus is taken from your nose and sent to a lab and screened for bacteria.  Nasal cytology A sample of mucus is taken from your nose and examined by your caregiver to determine if your sinusitis is related to an allergy. TREATMENT  Most cases of acute sinusitis are related to a viral infection and will resolve on their own within 10 days. Sometimes medicines are prescribed to help relieve symptoms (pain medicine, decongestants, nasal steroid sprays, or saline sprays).  However, for sinusitis related to a bacterial infection, your caregiver will prescribe antibiotic medicines. These are medicines that will help kill the bacteria causing the infection.  Rarely, sinusitis is caused by a fungal infection. In theses cases, your caregiver will prescribe antifungal medicine. For some cases of chronic sinusitis, surgery is needed. Generally, these are cases in which sinusitis recurs more than 3 times per year, despite other treatments. HOME CARE INSTRUCTIONS   Drink plenty of water. Water helps thin the mucus so your sinuses can drain more easily.  Use a humidifier.  Inhale steam 3 to 4 times a day (for example, sit in the bathroom  with the shower running).  Apply a warm, moist washcloth to your face 3 to 4 times a day, or as directed by your caregiver.  Use saline nasal sprays to help moisten and clean your sinuses.  Take over-the-counter or prescription medicines for pain, discomfort, or fever only as directed by your  caregiver. SEEK IMMEDIATE MEDICAL CARE IF:  You have increasing pain or severe headaches.  You have nausea, vomiting, or drowsiness.  You have swelling around your face.  You have vision problems.  You have a stiff neck.  You have difficulty breathing. MAKE SURE YOU:   Understand these instructions.  Will watch your condition.  Will get help right away if you are not doing well or get worse. Document Released: 06/22/2005 Document Revised: 09/14/2011 Document Reviewed: 07/07/2011 [facility] Patient Information 2014 [place], [place].

## 2013-08-25 NOTE — Progress Notes (Signed)
Pre visit review using our clinic review tool, if applicable. No additional management support is needed unless otherwise documented below in the visit note. 

## 2013-08-25 NOTE — Assessment & Plan Note (Signed)
Will Rx Azithromycin.  Rx Flonase.  Increase fluid intake.  Rest.  Mucinex.  Saline nasal spray.  Probiotic.  Stop Afrin.  Keep humidifier in the bedroom.  Symptom recurrence is possibly due to uncontrolled allergies or obstruction.  I recommend ENT or CT scan of sinuses for recurrent or persistent symptoms.  Patient will give some thought to this.

## 2013-08-25 NOTE — Progress Notes (Signed)
Patient presents to clinic today c/o continued sinus pressure, sinus pain, ear pain bilaterally and gum tenderness.  Denies fever, chills, aches.  Denies recent travel or sick contact.  Patient was seen on 08/07/13 and diagnosed with sinusitis.  Was given Rx for Amoxicillin. Patient endorses taking medication as directed.  States symptoms improved until antibiotic was finished.  Symptoms are now worsening fast.  Patient denies history of seasonal allergies.  Has been using Afrin over the past day.  Patient is aware of risk of rebound nasal congestion with prolonged Afrin usage.  Patient has never been evaluated by ENT for her recurrent sinus infection.  Patient does endorse history of deviated septum.    Past Medical History  Diagnosis Date  . Thyroid disease     Current Outpatient Prescriptions on File Prior to Visit  Medication Sig Dispense Refill  . escitalopram (LEXAPRO) 10 MG tablet Take 1 tablet (10 mg total) by mouth daily.  30 tablet  3  . levothyroxine (SYNTHROID, LEVOTHROID) 50 MCG tablet TAKE ONE TABLET BY MOUTH ONCE DAILY  30 tablet  5  . norgestimate-ethinyl estradiol (ORTHO-CYCLEN,SPRINTEC,PREVIFEM) 0.25-35 MG-MCG tablet Take 1 tablet by mouth daily.  1 Package  11   No current facility-administered medications on file prior to visit.    No Known Allergies  History reviewed. No pertinent family history.  History   Social History  . Marital Status: Married    Spouse Name: N/A    Number of Children: N/A  . Years of Education: N/A   Social History Main Topics  . Smoking status: Former Smoker -- 25 years    Types: Cigarettes    Quit date: 07/06/2005  . Smokeless tobacco: Never Used  . Alcohol Use: None  . Drug Use: None  . Sexual Activity: None   Other Topics Concern  . None   Social History Narrative  . None   Review of Systems - See HPI.  All other ROS are negative.  BP 122/80  Pulse 80  Temp(Src) 98.2 F (36.8 C) (Oral)  Resp 16  Wt 185 lb (83.915 kg)   SpO2 97%  Physical Exam  Vitals reviewed. Constitutional: She is oriented to person, place, and time and well-developed, well-nourished, and in no distress.  HENT:  Head: Normocephalic and atraumatic.  Right Ear: External ear normal.  Left Ear: External ear normal.  Nose: Nose normal.  Mouth/Throat: Oropharynx is clear and moist. No oropharyngeal exudate.  TM within normal limits bilaterally.  Tenderness to percussion of frontal sinuses bilaterally.  Uvula midline.  No evidence of posterior oropharyngeal erythema or edema.  Septum is deviated.  Nasal turbinates edematous.  Eyes: Conjunctivae are normal. Pupils are equal, round, and reactive to light.  Neck: Neck supple.  Cardiovascular: Normal rate, regular rhythm and normal heart sounds.   Pulmonary/Chest: Effort normal and breath sounds normal. No respiratory distress. She has no wheezes. She has no rales. She exhibits no tenderness.  Neurological: She is alert and oriented to person, place, and time.  Skin: Skin is warm and dry. No rash noted.  Psychiatric: Affect normal.   Assessment/Plan: Sinusitis Will Rx Azithromycin.  Rx Flonase.  Increase fluid intake.  Rest.  Mucinex.  Saline nasal spray.  Probiotic.  Stop Afrin.  Keep humidifier in the bedroom.  Symptom recurrence is possibly due to uncontrolled allergies or obstruction.  I recommend ENT or CT scan of sinuses for recurrent or persistent symptoms.  Patient will give some thought to this.

## 2013-09-16 ENCOUNTER — Other Ambulatory Visit: Payer: Self-pay | Admitting: Family Medicine

## 2013-09-18 NOTE — Telephone Encounter (Signed)
Letter mailed to notify pt for need for appt.

## 2013-09-26 ENCOUNTER — Telehealth: Payer: Self-pay

## 2013-09-26 NOTE — Telephone Encounter (Signed)
Left message for call back Identifiable   Pap-10/13/11-negative [facility] Flu Td

## 2013-09-29 ENCOUNTER — Encounter: Payer: Self-pay | Admitting: Family Medicine

## 2013-09-29 ENCOUNTER — Ambulatory Visit (INDEPENDENT_AMBULATORY_CARE_PROVIDER_SITE_OTHER): Payer: BC Managed Care – PPO | Admitting: Family Medicine

## 2013-09-29 VITALS — BP 120/74 | HR 72 | Temp 98.2°F | Resp 16 | Ht 68.0 in | Wt 187.4 lb

## 2013-09-29 DIAGNOSIS — R040 Epistaxis: Secondary | ICD-10-CM

## 2013-09-29 DIAGNOSIS — Z0000 Encounter for general adult medical examination without abnormal findings: Secondary | ICD-10-CM

## 2013-09-29 DIAGNOSIS — Z23 Encounter for immunization: Secondary | ICD-10-CM

## 2013-09-29 LAB — CBC WITH DIFFERENTIAL/PLATELET
Basophils Absolute: 0.1 10*3/uL (ref 0.0–0.1)
Basophils Relative: 1 % (ref 0–1)
EOS ABS: 0.1 10*3/uL (ref 0.0–0.7)
EOS PCT: 1 % (ref 0–5)
HCT: 38.7 % (ref 36.0–46.0)
HEMOGLOBIN: 13.1 g/dL (ref 12.0–15.0)
LYMPHS ABS: 1.5 10*3/uL (ref 0.7–4.0)
LYMPHS PCT: 29 % (ref 12–46)
MCH: 31.6 pg (ref 26.0–34.0)
MCHC: 33.9 g/dL (ref 30.0–36.0)
MCV: 93.5 fL (ref 78.0–100.0)
MONOS PCT: 6 % (ref 3–12)
Monocytes Absolute: 0.3 10*3/uL (ref 0.1–1.0)
Neutro Abs: 3.3 10*3/uL (ref 1.7–7.7)
Neutrophils Relative %: 63 % (ref 43–77)
Platelets: 274 10*3/uL (ref 150–400)
RBC: 4.14 MIL/uL (ref 3.87–5.11)
RDW: 13.1 % (ref 11.5–15.5)
WBC: 5.3 10*3/uL (ref 4.0–10.5)

## 2013-09-29 NOTE — Progress Notes (Signed)
Pre visit review using our clinic review tool, if applicable. No additional management support is needed unless otherwise documented below in the visit note. 

## 2013-09-29 NOTE — Progress Notes (Signed)
   Subjective:    Patient ID: Stephanie Lynch, female    DOB: 1970-10-26, 43 y.o.   MRN: [id number]  HPI CPE- UTD on pap.  Due for mammo  Nose bleeds- occuring every day whether using Flonase or not.  Asking for ENT referral.  L nostril- will bleed for 5-10 minutes and then stop spontaneously.   Review of Systems Patient reports no vision/ hearing changes, adenopathy,fever, weight change,  persistant/recurrent hoarseness , swallowing issues, chest pain, palpitations, edema, persistant/recurrent cough, hemoptysis, dyspnea (rest/exertional/paroxysmal nocturnal), gastrointestinal bleeding (melena, rectal bleeding), abdominal pain, significant heartburn, bowel changes, GU symptoms (dysuria, hematuria, incontinence), Gyn symptoms (abnormal  bleeding, pain),  syncope, focal weakness, memory loss, numbness & tingling, skin/hair/nail changes, abnormal bruising, anxiety, or depression.     Objective:   Physical Exam General Appearance:    Alert, cooperative, no distress, appears stated age  Head:    Normocephalic, without obvious abnormality, atraumatic  Eyes:    PERRL, conjunctiva/corneas clear, EOM's intact, fundi    benign, both eyes  Ears:    Normal TM's and external ear canals, both ears  Nose:   Nares normal, septum midline, mucosa normal, no drainage    or sinus tenderness  Throat:   Lips, mucosa, and tongue normal; teeth and gums normal  Neck:   Supple, symmetrical, trachea midline, no adenopathy;    Thyroid: no enlargement/tenderness/nodules  Back:     Symmetric, no curvature, ROM normal, no CVA tenderness  Lungs:     Clear to auscultation bilaterally, respirations unlabored  Chest Wall:    No tenderness or deformity   Heart:    Regular rate and rhythm, S1 and S2 normal, no murmur, rub   or gallop  Breast Exam:    Deferred to mammo  Abdomen:     Soft, non-tender, bowel sounds active all four quadrants,    no masses, no organomegaly  Genitalia:    Deferred  Rectal:    Extremities:    Extremities normal, atraumatic, no cyanosis or edema  Pulses:   2+ and symmetric all extremities  Skin:   Skin color, texture, turgor normal, no rashes or lesions  Lymph nodes:   Cervical, supraclavicular, and axillary nodes normal  Neurologic:   CNII-XII intact, normal strength, sensation and reflexes    throughout          Assessment & Plan:

## 2013-09-29 NOTE — Patient Instructions (Signed)
Follow up in 3 months to recheck weight loss progress We'll notify you of your lab results and make any changes if needed We'll call you with your ENT appt [facility]- [phone]- to schedule mammogram Call with any questions or concerns Keep up the good work! Happy Spring!

## 2013-09-30 LAB — LIPID PANEL
CHOLESTEROL: 172 mg/dL (ref 0–200)
HDL: 70 mg/dL (ref >39)
LDL Cholesterol: 86 mg/dL (ref 0–99)
Total CHOL/HDL Ratio: 2.5 Ratio
Triglycerides: 79 mg/dL (ref <150)
VLDL: 16 mg/dL (ref 0–40)

## 2013-09-30 LAB — BASIC METABOLIC PANEL
BUN: 14 mg/dL (ref 6–23)
CALCIUM: 9.9 mg/dL (ref 8.4–10.5)
CHLORIDE: 100 meq/L (ref 96–112)
CO2: 27 meq/L (ref 19–32)
Creat: 0.8 mg/dL (ref 0.50–1.10)
GLUCOSE: 87 mg/dL (ref 70–99)
Potassium: 4.3 mEq/L (ref 3.5–5.3)
Sodium: 138 mEq/L (ref 135–145)

## 2013-09-30 LAB — TSH: TSH: 1.003 u[IU]/mL (ref 0.350–4.500)

## 2013-09-30 LAB — HEPATIC FUNCTION PANEL
ALT: 11 U/L (ref 0–35)
AST: 28 U/L (ref 0–37)
Albumin: 4.1 g/dL (ref 3.5–5.2)
Alkaline Phosphatase: 70 U/L (ref 39–117)
BILIRUBIN INDIRECT: 0.3 mg/dL (ref 0.2–1.2)
BILIRUBIN TOTAL: 0.4 mg/dL (ref 0.2–1.2)
Bilirubin, Direct: 0.1 mg/dL (ref 0.0–0.3)
Total Protein: 6.7 g/dL (ref 6.0–8.3)

## 2013-10-01 NOTE — Assessment & Plan Note (Signed)
Pt's PE WNL.  Due for mammo, UTD on pap.  Check labs.  Anticipatory guidance provided.

## 2013-10-01 NOTE — Assessment & Plan Note (Signed)
New.  Refer to ENT.

## 2013-10-04 NOTE — Telephone Encounter (Signed)
Unable to reach patient pre visit.  

## 2013-10-05 LAB — VITAMIN D 1,25 DIHYDROXY
Vitamin D 1, 25 (OH)2 Total: 65 pg/mL (ref 18–72)
Vitamin D2 1, 25 (OH)2: <8 pg/mL
Vitamin D3 1, 25 (OH)2: 65 pg/mL

## 2013-10-15 ENCOUNTER — Other Ambulatory Visit: Payer: Self-pay | Admitting: Family Medicine

## 2013-10-16 NOTE — Telephone Encounter (Signed)
Med filled.  

## 2013-10-20 ENCOUNTER — Other Ambulatory Visit: Payer: Self-pay | Admitting: Family Medicine

## 2013-10-23 NOTE — Telephone Encounter (Signed)
Med filled.  

## 2013-11-06 ENCOUNTER — Encounter: Payer: Self-pay | Admitting: Family Medicine

## 2014-01-12 ENCOUNTER — Ambulatory Visit (INDEPENDENT_AMBULATORY_CARE_PROVIDER_SITE_OTHER): Payer: BC Managed Care – PPO | Admitting: Family Medicine

## 2014-01-12 ENCOUNTER — Encounter: Payer: Self-pay | Admitting: Family Medicine

## 2014-01-12 VITALS — BP 120/80 | HR 69 | Temp 98.2°F | Resp 16 | Wt 183.1 lb

## 2014-01-12 DIAGNOSIS — L719 Rosacea, unspecified: Secondary | ICD-10-CM

## 2014-01-12 MED ORDER — NORGESTIMATE-ETH ESTRADIOL 0.25-35 MG-MCG PO TABS: ORAL_TABLET | ORAL | Status: DC

## 2014-01-12 MED ORDER — METRONIDAZOLE 0.75 % EX GEL: 1.0000 "application " | Freq: Two times a day (BID) | CUTANEOUS | Status: DC

## 2014-01-12 NOTE — Patient Instructions (Signed)
Follow up as needed Start applying the Metrogel nightly at first- increase to twice daily if needed (may initially cause increased redness) Make sure to wear sunscreen daily- this can be part of your daily moisturizer Call with any questions or concerns Have a great summer!!!

## 2014-01-12 NOTE — Progress Notes (Signed)
   Subjective:    Patient ID: Stephanie CousinKristen M Lynch, female    DOB: [birth year], 43 y.o.   MRN: [id number]  HPI Rosacea- pt has hx of this and was previously getting treatment but can't remember the name.  + sun sensitivity.  Face is now dry and peeling, red.  Not using face wash or chemicals.  Increased stress.     Review of Systems For ROS see HPI     Objective:   Physical Exam  Vitals reviewed. Constitutional: She appears well-developed and well-nourished. No distress.  Skin: Skin is warm and dry. There is erythema (facial erythema w/ scattered, small pustules consistent w/ rosacea).          Assessment & Plan:

## 2014-01-12 NOTE — Progress Notes (Signed)
Pre visit review using our clinic review tool, if applicable. No additional management support is needed unless otherwise documented below in the visit note. 

## 2014-01-12 NOTE — Assessment & Plan Note (Signed)
New to provider, ongoing for pt.  Start Metrogel.  Reviewed need for sunscreen.  Will follow.

## 2014-03-15 ENCOUNTER — Telehealth: Payer: Self-pay | Admitting: Family Medicine

## 2014-03-15 NOTE — Telephone Encounter (Signed)
Pt scheduled  

## 2014-03-15 NOTE — Telephone Encounter (Signed)
Pt need a follow up for Thyroid

## 2014-03-15 NOTE — Telephone Encounter (Signed)
Caller name: Flossie  Call back number:[phone]   Reason for call:  Pt is concerned symptoms verses medication for thyroid.  Pt is having a lot of old thyroid symptoms returning and wants to know if she needs to come in or just have labs ordered.

## 2014-03-20 ENCOUNTER — Ambulatory Visit (INDEPENDENT_AMBULATORY_CARE_PROVIDER_SITE_OTHER): Payer: BC Managed Care – PPO | Admitting: Family Medicine

## 2014-03-20 ENCOUNTER — Encounter: Payer: Self-pay | Admitting: Family Medicine

## 2014-03-20 VITALS — BP 120/80 | HR 74 | Temp 98.0°F | Resp 16 | Wt 190.0 lb

## 2014-03-20 DIAGNOSIS — Z23 Encounter for immunization: Secondary | ICD-10-CM

## 2014-03-20 DIAGNOSIS — E039 Hypothyroidism, unspecified: Secondary | ICD-10-CM

## 2014-03-20 LAB — T3, FREE: T3, Free: 3.2 pg/mL (ref 2.3–4.2)

## 2014-03-20 LAB — TSH: TSH: 2.47 u[IU]/mL (ref 0.35–4.50)

## 2014-03-20 LAB — T4, FREE: Free T4: 0.87 ng/dL (ref 0.60–1.60)

## 2014-03-20 NOTE — Assessment & Plan Note (Signed)
Chronic problem.  Pt is convinced that her levels are not right and that her medication needs to be adjusted due to her sxs of weight gain and fatigue.  Will check labs but discussed w/ pt that her sedentary lifestyle and poor diet choices will also contribute to weight gain, fatigue, constipation.  Stressed need to improve lifestyle.  Will adjust meds prn.  Pt expressed understanding and is in agreement w/ plan.

## 2014-03-20 NOTE — Patient Instructions (Signed)
Follow up as needed We'll notify you of your lab results and make any changes if needed Try and make healthy food choices and get regular exercise Call with any questions or concerns Hang in there!!

## 2014-03-20 NOTE — Progress Notes (Signed)
Pre visit review using our clinic review tool, if applicable. No additional management support is needed unless otherwise documented below in the visit note. 

## 2014-03-20 NOTE — Progress Notes (Signed)
   Subjective:    Patient ID: Stephanie Lynch, female    DOB: 12-Apr-1971, 43 y.o.   MRN: [id number]  HPI Hypothyroid- chronic problem, on daily.  Pt reports ongoing fatigue, weight gain, constipation, joint pains.  Feels similar to when pt was initially dx'd.  Pt is not working out regularly.  Sedentary lifestyle.  Admits to eating a lot of 'kid friendly' food which includes drive thru.   Review of Systems For ROS see HPI     Objective:   Physical Exam  Vitals reviewed. Constitutional: She is oriented to person, place, and time. She appears well-developed and well-nourished. No distress.  HENT:  Head: Normocephalic and atraumatic.  Eyes: Conjunctivae and EOM are normal. Pupils are equal, round, and reactive to light.  Neck: Normal range of motion. Neck supple. No thyromegaly present.  Cardiovascular: Normal rate, regular rhythm, normal heart sounds and intact distal pulses.   No murmur heard. Pulmonary/Chest: Effort normal and breath sounds normal. No respiratory distress.  Abdominal: Soft. She exhibits no distension. There is no tenderness.  Musculoskeletal: She exhibits no edema.  Lymphadenopathy:    She has no cervical adenopathy.  Neurological: She is alert and oriented to person, place, and time.  Skin: Skin is warm and dry.  Psychiatric: She has a normal mood and affect. Her behavior is normal.          Assessment & Plan:

## 2014-05-29 ENCOUNTER — Other Ambulatory Visit: Payer: Self-pay | Admitting: General Practice

## 2014-05-29 MED ORDER — LEVOTHYROXINE SODIUM 50 MCG PO TABS: ORAL_TABLET | ORAL | Status: DC

## 2014-06-17 ENCOUNTER — Telehealth: Payer: BC Managed Care – PPO | Admitting: Physician Assistant

## 2014-06-17 DIAGNOSIS — H01004 Unspecified blepharitis left upper eyelid: Secondary | ICD-10-CM

## 2014-06-17 MED ORDER — BACITRACIN-POLYMYXIN B 500-10000 UNIT/GM OP OINT: 1.0000 "application " | TOPICAL_OINTMENT | Freq: Two times a day (BID) | OPHTHALMIC | Status: DC

## 2014-06-17 NOTE — Progress Notes (Signed)
We are sorry that you are not feeling well.  Here is how we plan to help!  Based on what you have shared with me it looks like you have blepharitis. Blepharitis is a common inflammatory or infectious condition of the eyelid.  In most cases it is bacterial.  We have made appropriate suggestions for you based upon your presentation.  I have prescribed Bacitracin Ophthalmic ointment to apply to inside of eyelid twice daily over the next 1-2 weeks. Use artificial tears to keep eye lubricated. Also if you wear contacts throw your current pain away and do not put in a new pair until infection resolves.  Your Red eye was likely conjunctivitis but it seems to have cleared living you with only the infected eye lid we are treating.  That being said, if any of those symptoms recur, please let either myself or Dr. Beverely Lowabori know.  I work with her so the phone number is the same!   Call us if symptoms are not improved in 1-2 days.  Home Care:  Wash your hands often!  Do not wear your contacts until you complete your treatment plan.  Avoid sharing towels, bed linen, personal items with a person who has pink eye.  See attention for anyone in your home with similar symptoms.  Get Help Right Away If:  Your symptoms do not improve.  You develop blurred or loss of vision.  Your symptoms worsen (increased discharge, pain or redness)  Your e-visit answers were reviewed by a board certified advanced clinical practitioner to complete your personal care plan.  Depending on the condition, your plan could have included both over the counter or prescription medications.  Please review your pharmacy choice.  If there is a problem, you may call our nursing hot line at [phone] and have the prescription routed to another pharmacy.  Your safety is important to us.  If you have drug allergies check your prescription carefully.    You can use MyChart to ask questions about today's visit, request a non-urgent call  back, or ask for a work or school excuse.  You will get an e-mail in the next two days asking about your experience.  I hope that your e-visit has been valuable and will speed your recovery. Thank you for using e-visits.

## 2014-07-19 ENCOUNTER — Other Ambulatory Visit: Payer: Self-pay

## 2014-07-19 DIAGNOSIS — Z1231 Encounter for screening mammogram for malignant neoplasm of breast: Secondary | ICD-10-CM

## 2014-07-31 ENCOUNTER — Ambulatory Visit
Admission: RE | Admit: 2014-07-31 | Discharge: 2014-07-31 | Disposition: A | Discharge status: Home / Self Care | Payer: BLUE CROSS/BLUE SHIELD | Source: Ambulatory Visit

## 2014-07-31 DIAGNOSIS — Z1231 Encounter for screening mammogram for malignant neoplasm of breast: Secondary | ICD-10-CM

## 2014-11-12 ENCOUNTER — Telehealth: Payer: Self-pay | Admitting: Family Medicine

## 2014-11-12 NOTE — Telephone Encounter (Signed)
Caller name: Baxter HireKristen Relation to pt: self Call back number: [phone] [facility]acy: [facility] [address]  Reason for call:   Patient states that her puppy has intestinal worms and states that the vet told her that this can be transmitted to humans and wants to know if she should be concerned?

## 2014-11-12 NOTE — Telephone Encounter (Signed)
Typically you would have symptoms with any sort of intestinal parasite (there are various types).  Unless you develop symptoms, I would not treat at this time

## 2014-11-12 NOTE — Telephone Encounter (Signed)
Pt notified and will call if she has any concerns.

## 2014-12-24 ENCOUNTER — Other Ambulatory Visit: Payer: Self-pay | Admitting: Family Medicine

## 2014-12-24 NOTE — Telephone Encounter (Signed)
Med filled.  

## 2015-01-04 ENCOUNTER — Telehealth: Payer: Self-pay | Admitting: *Deleted

## 2015-01-04 NOTE — Telephone Encounter (Signed)
Unable to reach patient at time of Pre-Visit Call.  Left message for patient to return call when available.    

## 2015-01-08 ENCOUNTER — Encounter: Payer: Self-pay | Admitting: Family Medicine

## 2015-01-08 ENCOUNTER — Ambulatory Visit (INDEPENDENT_AMBULATORY_CARE_PROVIDER_SITE_OTHER): Payer: BLUE CROSS/BLUE SHIELD | Admitting: Family Medicine

## 2015-01-08 VITALS — BP 124/76 | HR 82 | Temp 98.6°F | Resp 16 | Ht 68.0 in | Wt 197.0 lb

## 2015-01-08 DIAGNOSIS — Z0000 Encounter for general adult medical examination without abnormal findings: Secondary | ICD-10-CM

## 2015-01-08 NOTE — Progress Notes (Signed)
Pre visit review using our clinic review tool, if applicable. No additional management support is needed unless otherwise documented below in the visit note. 

## 2015-01-08 NOTE — Patient Instructions (Signed)
Schedule your pap in the next 1-2 weeks We'll notify you of your lab results and make any changes if needed Keep up the good work on healthy diet and regular exercise Call with any questions or concerns Have a great summer!!!

## 2015-01-08 NOTE — Progress Notes (Signed)
   Subjective:    Patient ID: Stephanie CousinKristen M Veronica, female    DOB: 1971/02/23, 44 y.o.   MRN: [id number]  HPI CPE- UTD on mammo.  Is due for pap- wants to come back in 2 weeks.   Review of Systems Patient reports no vision/ hearing changes, adenopathy,fever, weight change,  persistant/recurrent hoarseness , swallowing issues, chest pain, palpitations, edema, persistant/recurrent cough, hemoptysis, dyspnea (rest/exertional/paroxysmal nocturnal), gastrointestinal bleeding (melena, rectal bleeding), abdominal pain, significant heartburn, bowel changes, GU symptoms (dysuria, hematuria, incontinence), Gyn symptoms (abnormal  bleeding, pain),  syncope, focal weakness, memory loss, numbness & tingling, skin/hair/nail changes, abnormal bruising or bleeding, anxiety, or depression.     Objective:   Physical Exam General Appearance:    Alert, cooperative, no distress, appears stated age  Head:    Normocephalic, without obvious abnormality, atraumatic  Eyes:    PERRL, conjunctiva/corneas clear, EOM's intact, fundi    benign, both eyes  Ears:    Normal TM's and external ear canals, both ears  Nose:   Nares normal, septum midline, mucosa normal, no drainage    or sinus tenderness  Throat:   Lips, mucosa, and tongue normal; teeth and gums normal  Neck:   Supple, symmetrical, trachea midline, no adenopathy;    Thyroid: no enlargement/tenderness/nodules  Back:     Symmetric, no curvature, ROM normal, no CVA tenderness  Lungs:     Clear to auscultation bilaterally, respirations unlabored  Chest Wall:    No tenderness or deformity   Heart:    Regular rate and rhythm, S1 and S2 normal, no murmur, rub   or gallop  Breast Exam:    Deferred to mammo  Abdomen:     Soft, non-tender, bowel sounds active all four quadrants,    no masses, no organomegaly  Genitalia:    Deferred  Rectal:    Extremities:   Extremities normal, atraumatic, no cyanosis or edema  Pulses:   2+ and symmetric all extremities  Skin:    Skin color, texture, turgor normal, no rashes or lesions  Lymph nodes:   Cervical, supraclavicular, and axillary nodes normal  Neurologic:   CNII-XII intact, normal strength, sensation and reflexes    throughout          Assessment & Plan:

## 2015-01-09 ENCOUNTER — Other Ambulatory Visit: Payer: Self-pay | Admitting: General Practice

## 2015-01-09 LAB — BASIC METABOLIC PANEL
BUN: 12 mg/dL (ref 6–23)
CALCIUM: 9.4 mg/dL (ref 8.4–10.5)
CO2: 28 mEq/L (ref 19–32)
Chloride: 103 mEq/L (ref 96–112)
Creatinine, Ser: 0.76 mg/dL (ref 0.40–1.20)
GFR: 88.4 mL/min (ref >60.00)
Glucose, Bld: 78 mg/dL (ref 70–99)
Potassium: 4.5 mEq/L (ref 3.5–5.1)
SODIUM: 139 meq/L (ref 135–145)

## 2015-01-09 LAB — LIPID PANEL
Cholesterol: 216 mg/dL — ABNORMAL HIGH (ref 0–200)
HDL: 69.7 mg/dL (ref >39.00)
LDL Cholesterol: 128 mg/dL — ABNORMAL HIGH (ref 0–99)
NONHDL: 146.3
Total CHOL/HDL Ratio: 3
Triglycerides: 90 mg/dL (ref 0.0–149.0)
VLDL: 18 mg/dL (ref 0.0–40.0)

## 2015-01-09 LAB — CBC WITH DIFFERENTIAL/PLATELET
BASOS PCT: 0.9 % (ref 0.0–3.0)
Basophils Absolute: 0 10*3/uL (ref 0.0–0.1)
Eosinophils Absolute: 0.1 10*3/uL (ref 0.0–0.7)
Eosinophils Relative: 1.8 % (ref 0.0–5.0)
HCT: 40.7 % (ref 36.0–46.0)
HEMOGLOBIN: 13.6 g/dL (ref 12.0–15.0)
LYMPHS ABS: 1.7 10*3/uL (ref 0.7–4.0)
Lymphocytes Relative: 33.8 % (ref 12.0–46.0)
MCHC: 33.3 g/dL (ref 30.0–36.0)
MCV: 94.9 fl (ref 78.0–100.0)
MONO ABS: 0.5 10*3/uL (ref 0.1–1.0)
MONOS PCT: 9.3 % (ref 3.0–12.0)
NEUTROS ABS: 2.7 10*3/uL (ref 1.4–7.7)
Neutrophils Relative %: 54.2 % (ref 43.0–77.0)
Platelets: 254 10*3/uL (ref 150.0–400.0)
RBC: 4.29 Mil/uL (ref 3.87–5.11)
RDW: 13 % (ref 11.5–15.5)
WBC: 5 10*3/uL (ref 4.0–10.5)

## 2015-01-09 LAB — HEPATIC FUNCTION PANEL
ALT: 10 U/L (ref 0–35)
AST: 14 U/L (ref 0–37)
Albumin: 3.8 g/dL (ref 3.5–5.2)
Alkaline Phosphatase: 69 U/L (ref 39–117)
BILIRUBIN DIRECT: 0 mg/dL (ref 0.0–0.3)
BILIRUBIN TOTAL: 0.3 mg/dL (ref 0.2–1.2)
Total Protein: 7.4 g/dL (ref 6.0–8.3)

## 2015-01-09 LAB — VITAMIN D 25 HYDROXY (VIT D DEFICIENCY, FRACTURES): VITD: 23.99 ng/mL — ABNORMAL LOW (ref 30.00–100.00)

## 2015-01-09 LAB — TSH: TSH: 2.15 u[IU]/mL (ref 0.35–4.50)

## 2015-01-09 MED ORDER — VITAMIN D (ERGOCALCIFEROL) 1.25 MG (50000 UNIT) PO CAPS: 50000.0000 [IU] | ORAL_CAPSULE | ORAL | Status: DC

## 2015-01-20 NOTE — Assessment & Plan Note (Signed)
Pt's PE WNL.  UTD on mammo.  Due for pap- will reschedule.  Check labs.  Anticipatory guidance provided.

## 2015-01-22 ENCOUNTER — Other Ambulatory Visit (HOSPITAL_COMMUNITY)
Admission: RE | Admit: 2015-01-22 | Discharge: 2015-01-22 | Disposition: A | Discharge status: Home / Self Care | Payer: BLUE CROSS/BLUE SHIELD | Source: Ambulatory Visit | Attending: Family Medicine | Admitting: Family Medicine

## 2015-01-22 ENCOUNTER — Encounter: Payer: Self-pay | Admitting: Family Medicine

## 2015-01-22 ENCOUNTER — Ambulatory Visit (INDEPENDENT_AMBULATORY_CARE_PROVIDER_SITE_OTHER): Payer: BLUE CROSS/BLUE SHIELD | Admitting: Family Medicine

## 2015-01-22 VITALS — BP 122/72 | HR 72 | Temp 98.2°F | Resp 16 | Ht 68.0 in | Wt 195.1 lb

## 2015-01-22 DIAGNOSIS — Z1151 Encounter for screening for human papillomavirus (HPV): Secondary | ICD-10-CM | POA: Insufficient documentation

## 2015-01-22 DIAGNOSIS — Z01419 Encounter for gynecological examination (general) (routine) without abnormal findings: Secondary | ICD-10-CM | POA: Insufficient documentation

## 2015-01-22 DIAGNOSIS — Z124 Encounter for screening for malignant neoplasm of cervix: Secondary | ICD-10-CM | POA: Diagnosis not present

## 2015-01-22 NOTE — Progress Notes (Signed)
   Subjective:    Patient ID: Stephanie CousinKristen M Cichowski, female    DOB: [birth year], 44 y.o.   MRN: [id number]  HPI GYN exam- pt here today for pap and breast exam.  No lumps, skin changes, or nipple discharge.  Normal mammo.  No vaginal d/c, pelvic pain, masses, or other concerns.   Review of Systems For ROS see HPI     Objective:   Physical Exam  Constitutional: She is oriented to person, place, and time. She appears well-developed and well-nourished. No distress.  Pulmonary/Chest: Right breast exhibits no inverted nipple, no mass, no nipple discharge, no skin change and no tenderness. Left breast exhibits no inverted nipple, no mass, no nipple discharge, no skin change and no tenderness.  Genitourinary: Rectal exam shows no external hemorrhoid. There is no rash, tenderness, lesion or injury on the right labia. There is no rash, tenderness, lesion or injury on the left labia. Uterus is not deviated, not enlarged, not fixed and not tender. Cervix exhibits friability. Cervix exhibits no motion tenderness and no discharge. Right adnexum displays no mass, no tenderness and no fullness. Left adnexum displays no mass, no tenderness and no fullness. No erythema, tenderness or bleeding in the vagina. No foreign body around the vagina. No signs of injury around the vagina. No vaginal discharge found.  Neurological: She is alert and oriented to person, place, and time.  Skin: Skin is warm and dry. No erythema.  Psychiatric: She has a normal mood and affect. Her behavior is normal. Thought content normal.  Vitals reviewed.         Assessment & Plan:

## 2015-01-22 NOTE — Progress Notes (Signed)
Pre visit review using our clinic review tool, if applicable. No additional management support is needed unless otherwise documented below in the visit note. 

## 2015-01-22 NOTE — Assessment & Plan Note (Signed)
Pap collected.  Breast exam done.  No abnormalities noted.  Suspect cervical friability/bleeding due to pt being mid-cycle.

## 2015-01-22 NOTE — Patient Instructions (Signed)
Follow up as needed We'll notify you of your lab results and make any changes if needed Keep up the good work! Enjoy the rest of your summer!

## 2015-01-23 ENCOUNTER — Ambulatory Visit: Payer: BLUE CROSS/BLUE SHIELD | Admitting: Family Medicine

## 2015-01-23 LAB — CYTOLOGY - PAP

## 2015-01-24 ENCOUNTER — Encounter: Payer: Self-pay | Admitting: General Practice

## 2015-02-06 ENCOUNTER — Other Ambulatory Visit: Payer: Self-pay | Admitting: Family Medicine

## 2015-02-06 NOTE — Telephone Encounter (Signed)
Medication filled to pharmacy as requested.   

## 2015-03-22 ENCOUNTER — Encounter: Payer: Self-pay | Admitting: Family Medicine

## 2015-03-26 ENCOUNTER — Other Ambulatory Visit: Payer: Self-pay | Admitting: Family Medicine

## 2015-03-26 NOTE — Telephone Encounter (Signed)
Medication filled to pharmacy as requested.   

## 2015-04-01 ENCOUNTER — Encounter: Payer: Self-pay | Admitting: Family Medicine

## 2015-04-01 ENCOUNTER — Ambulatory Visit (INDEPENDENT_AMBULATORY_CARE_PROVIDER_SITE_OTHER): Payer: BLUE CROSS/BLUE SHIELD | Admitting: Family Medicine

## 2015-04-01 ENCOUNTER — Other Ambulatory Visit: Payer: Self-pay | Admitting: Family Medicine

## 2015-04-01 ENCOUNTER — Ambulatory Visit (HOSPITAL_BASED_OUTPATIENT_CLINIC_OR_DEPARTMENT_OTHER)
Admission: RE | Admit: 2015-04-01 | Discharge: 2015-04-01 | Disposition: A | Discharge status: Home / Self Care | Payer: BLUE CROSS/BLUE SHIELD | Source: Ambulatory Visit | Attending: Family Medicine | Admitting: Family Medicine

## 2015-04-01 VITALS — BP 126/80 | HR 90 | Temp 98.2°F | Resp 16 | Wt 198.5 lb

## 2015-04-01 DIAGNOSIS — E039 Hypothyroidism, unspecified: Secondary | ICD-10-CM | POA: Diagnosis not present

## 2015-04-01 DIAGNOSIS — E010 Iodine-deficiency related diffuse (endemic) goiter: Secondary | ICD-10-CM | POA: Diagnosis present

## 2015-04-01 DIAGNOSIS — E042 Nontoxic multinodular goiter: Secondary | ICD-10-CM | POA: Insufficient documentation

## 2015-04-01 DIAGNOSIS — R221 Localized swelling, mass and lump, neck: Secondary | ICD-10-CM

## 2015-04-01 DIAGNOSIS — E038 Other specified hypothyroidism: Secondary | ICD-10-CM | POA: Diagnosis not present

## 2015-04-01 DIAGNOSIS — E041 Nontoxic single thyroid nodule: Secondary | ICD-10-CM

## 2015-04-01 LAB — TSH: TSH: 2.91 u[IU]/mL (ref 0.35–4.50)

## 2015-04-01 LAB — T3, FREE: T3, Free: 3.1 pg/mL (ref 2.3–4.2)

## 2015-04-01 LAB — T4, FREE: Free T4: 0.93 ng/dL (ref 0.60–1.60)

## 2015-04-01 NOTE — Patient Instructions (Signed)
We'll notify you of your lab results and make any changes if needed Go downstairs and get your ultrasound after you get your labs drawn We'll determine the next steps based on these results Call with any questions or concerns Hang in there!

## 2015-04-01 NOTE — Assessment & Plan Note (Signed)
New.  Pt's thyroid is mildly full on R w/o nodularity or tenderness to palpitation.  Known hypothyroid.  Check labs.  Get [place] to assess.  Will follow.

## 2015-04-01 NOTE — Progress Notes (Signed)
   Subjective:    Patient ID: Stephanie Lynch, female    DOB: 12/18/1970, 44 y.o.   MRN: [id number]  HPI Neck swelling- pt noticed yesterday in the mirror that R lower neck was swollen when compared to L side.  Not hard, not tender.  Hx of hypothyroid.  Pt is very concerned.  Has been online and now worried about Hashimotos   Review of Systems For ROS see HPI     Objective:   Physical Exam  Constitutional: She is oriented to person, place, and time. She appears well-developed and well-nourished. No distress.  HENT:  Head: Normocephalic and atraumatic.  Neck: Normal range of motion. Neck supple. Thyromegaly (diffuse fullness on R w/o nodularity or tenderness) present.  Neurological: She is alert and oriented to person, place, and time.  Skin: Skin is warm and dry.  Psychiatric: She has a normal mood and affect. Her behavior is normal. Thought content normal.  Vitals reviewed.         Assessment & Plan:

## 2015-04-01 NOTE — Progress Notes (Signed)
Pre visit review using our clinic review tool, if applicable. No additional management support is needed unless otherwise documented below in the visit note. 

## 2015-04-03 ENCOUNTER — Telehealth: Payer: Self-pay | Admitting: Family Medicine

## 2015-04-03 NOTE — Telephone Encounter (Signed)
Spoke with pt and answered all questions

## 2015-04-03 NOTE — Telephone Encounter (Signed)
Pt is wanting to discuss endo appt. She is scheduled for tomorrow 9/29 but said that she needs some advise/info from [place] or Dr. Beverely Low before going.

## 2015-04-04 ENCOUNTER — Encounter: Payer: Self-pay | Admitting: Endocrinology

## 2015-04-04 ENCOUNTER — Ambulatory Visit (INDEPENDENT_AMBULATORY_CARE_PROVIDER_SITE_OTHER): Payer: BLUE CROSS/BLUE SHIELD | Admitting: Endocrinology

## 2015-04-04 VITALS — BP 130/88 | HR 77 | Temp 98.2°F | Ht 68.0 in | Wt 198.0 lb

## 2015-04-04 DIAGNOSIS — IMO0001 Reserved for inherently not codable concepts without codable children: Secondary | ICD-10-CM | POA: Insufficient documentation

## 2015-04-04 DIAGNOSIS — M791 Myalgia: Secondary | ICD-10-CM | POA: Diagnosis not present

## 2015-04-04 DIAGNOSIS — M609 Myositis, unspecified: Secondary | ICD-10-CM

## 2015-04-04 DIAGNOSIS — E039 Hypothyroidism, unspecified: Secondary | ICD-10-CM | POA: Diagnosis not present

## 2015-04-04 LAB — CK: CK TOTAL: 87 U/L (ref 7–177)

## 2015-04-04 NOTE — Patient Instructions (Addendum)
blood tests are requested for you today.  We'll let you know about the results.  Please continue the same medication.   The thyroid ultrasound should be rechecked in 1-2 years.   I would be happy to see you back here as necessary.

## 2015-04-04 NOTE — Progress Notes (Signed)
Subjective:    Patient ID: Stephanie Lynch, female    DOB: Mar 13, 1971, 44 y.o.   MRN: [id number]  HPI Pt reports hypothyroidism was dx'ed in 1999.  she has been on prescribed thyroid hormone therapy since then.  she has never taken kelp or any other type of non-prescribed thyroid product.  She is not considering a pregnancy.  she has never had thyroid surgery, or XRT to the neck.  He has never been on amiodarone or lithium.  She has slight swelling at the anterior neck, but no assoc pain.  She has reg menses.  Past Medical History  Diagnosis Date  . Thyroid disease     No past surgical history on file.  Social History   Social History  . Marital Status: Married    Spouse Name: N/A  . Number of Children: N/A  . Years of Education: N/A   Occupational History  . Not on file.   Social History Main Topics  . Smoking status: Former Smoker -- 25 years    Types: Cigarettes    Quit date: 07/06/2005  . Smokeless tobacco: Never Used  . Alcohol Use: Not on file  . Drug Use: Not on file  . Sexual Activity: Not on file   Other Topics Concern  . Not on file   Social History Narrative    Current Outpatient Prescriptions on File Prior to Visit  Medication Sig Dispense Refill  . levothyroxine (SYNTHROID, LEVOTHROID) 50 MCG tablet TAKE ONE TABLET BY MOUTH ONCE DAILY 30 tablet 6  . metroNIDAZOLE (METROGEL) 0.75 % gel Apply 1 application topically 2 (two) times daily. 45 g 3  . SPRINTEC 28 0.25-35 MG-MCG tablet TAKE ONE TABLET BY MOUTH ONCE DAILY 28 tablet 12   No current facility-administered medications on file prior to visit.    No Known Allergies  Family History  Problem Relation Age of Onset  . Thyroid disease Maternal Grandmother     BP 130/88 mmHg  Pulse 77  Temp(Src) 98.2 F (36.8 C) (Oral)  Ht  (1.727 m)  Wt 198 lb (89.812 kg)  BMI 30.11 kg/m2  SpO2 95%  Review of Systems denies depression, hair loss, muscle cramps, sob, numbness, blurry vision, cold  intolerance, rhinorrhea, easy bruising, and syncope.  She has chronic weight gain, constipation, easy bruising, dry skin, myalgias, and fatigue.     Objective:   Physical Exam VS: see vs page GEN: no distress HEAD: head: no deformity eyes: no periorbital swelling, no proptosis external nose and ears are normal mouth: no lesion seen NECK: supple, thyroid is not enlarged CHEST WALL: no deformity LUNGS:  Clear to auscultation CV: reg rate and rhythm, no murmur ABD: abdomen is soft, nontender.  no hepatosplenomegaly.  not distended.  no hernia. MUSCULOSKELETAL: muscle bulk and strength are grossly normal.  no obvious joint swelling.  gait is normal and steady.  No muscle tenderness.  EXTEMITIES: no deformity.  no ulcer on the feet.  feet are of normal color and temp.  no edema PULSES: dorsalis pedis intact bilat.  no carotid bruit NEURO:  cn 2-12 grossly intact.   readily moves all 4's.  sensation is intact to touch on the feet SKIN:  Normal texture and temperature.  No rash or suspicious lesion is visible.  Not diaphoretic. NODES:  None palpable at the neck PSYCH: alert, well-oriented.  Does not appear anxious nor depressed.   Lab Results  Component Value Date   TSH 2.91 04/01/2015   Radiol: thyroid  US (04/01/15): Bilateral nodules. Largest nodule measures up to 0.9 cm    Assessment & Plan:  Very small multinodular goiter, new, unlikely to be clinically significant. Weight gain, not thyroid-related Myalgias, new, uncertain etiology.  We'll check labs, as she is at risk for other autoimmune conditions.   Patient is advised the following: Patient Instructions  blood tests are requested for you today.  We'll let you know about the results.  Please continue the same medication.   The thyroid ultrasound should be rechecked in 1-2 years.   I would be happy to see you back here as necessary.

## 2015-04-05 LAB — THYROID PEROXIDASE ANTIBODY: THYROID PEROXIDASE ANTIBODY: 60 [IU]/mL — AB (ref <9)

## 2015-04-09 ENCOUNTER — Telehealth: Payer: Self-pay | Admitting: Endocrinology

## 2015-04-09 NOTE — Telephone Encounter (Signed)
Patient is calling for the results of her lab work °

## 2015-04-09 NOTE — Telephone Encounter (Signed)
See note below and please advise if the rest of the results have been resulted.

## 2015-04-09 NOTE — Telephone Encounter (Signed)
please call lab.  It says "future."  If it was not drawn, please call patient: It would need to be redrawn

## 2015-04-10 ENCOUNTER — Other Ambulatory Visit: Payer: Self-pay

## 2015-04-10 ENCOUNTER — Other Ambulatory Visit (INDEPENDENT_AMBULATORY_CARE_PROVIDER_SITE_OTHER): Payer: BLUE CROSS/BLUE SHIELD

## 2015-04-10 DIAGNOSIS — M609 Myositis, unspecified: Secondary | ICD-10-CM | POA: Diagnosis not present

## 2015-04-10 DIAGNOSIS — M791 Myalgia: Secondary | ICD-10-CM

## 2015-04-10 DIAGNOSIS — IMO0001 Reserved for inherently not codable concepts without codable children: Secondary | ICD-10-CM

## 2015-04-10 LAB — SEDIMENTATION RATE: Sed Rate: 16 mm/hr (ref 0–22)

## 2015-04-10 NOTE — Telephone Encounter (Signed)
I contacted the pt and advised Sedimentation rate was not done on 9/29. Patient is coming in for a redraw today at 58.

## 2015-04-11 ENCOUNTER — Encounter: Payer: Self-pay | Admitting: Endocrinology

## 2015-04-18 ENCOUNTER — Encounter: Payer: Self-pay | Admitting: Family Medicine

## 2015-04-18 ENCOUNTER — Other Ambulatory Visit: Payer: Self-pay | Admitting: General Practice

## 2015-04-18 DIAGNOSIS — E038 Other specified hypothyroidism: Secondary | ICD-10-CM

## 2015-04-18 MED ORDER — LEVOTHYROXINE SODIUM 75 MCG PO TABS: 75.0000 ug | ORAL_TABLET | Freq: Every day | ORAL | Status: DC

## 2015-06-25 ENCOUNTER — Encounter: Payer: Self-pay | Admitting: Family Medicine

## 2015-06-26 ENCOUNTER — Ambulatory Visit (INDEPENDENT_AMBULATORY_CARE_PROVIDER_SITE_OTHER): Payer: BLUE CROSS/BLUE SHIELD | Admitting: Physician Assistant

## 2015-06-26 ENCOUNTER — Encounter: Payer: Self-pay | Admitting: Physician Assistant

## 2015-06-26 VITALS — BP 140/88 | HR 92 | Temp 98.3°F | Ht 68.0 in | Wt 197.2 lb

## 2015-06-26 DIAGNOSIS — J019 Acute sinusitis, unspecified: Secondary | ICD-10-CM

## 2015-06-26 DIAGNOSIS — B9689 Other specified bacterial agents as the cause of diseases classified elsewhere: Secondary | ICD-10-CM | POA: Insufficient documentation

## 2015-06-26 MED ORDER — AMOXICILLIN-POT CLAVULANATE 875-125 MG PO TABS: 1.0000 | ORAL_TABLET | Freq: Two times a day (BID) | ORAL | Status: DC

## 2015-06-26 NOTE — Progress Notes (Signed)
Patient presents to clinic today c/o a couple of weeks of sinus pressure and nasal congestion worsening over the past 3 days with tooth pain, sinus pain, facial pain. Denies fever, SOB, chest congestion. Endorses some chills.   Past Medical History  Diagnosis Date  . Thyroid disease     Current Outpatient Prescriptions on File Prior to Visit  Medication Sig Dispense Refill  . cholecalciferol (VITAMIN D) 1000 UNITS tablet Take 2,000 Units by mouth daily.    Marland Kitchen. levothyroxine (SYNTHROID, LEVOTHROID) 75 MCG tablet Take 1 tablet (75 mcg total) by mouth daily. 30 tablet 6  . SPRINTEC 28 0.25-35 MG-MCG tablet TAKE ONE TABLET BY MOUTH ONCE DAILY 28 tablet 12  . metroNIDAZOLE (METROGEL) 0.75 % gel Apply 1 application topically 2 (two) times daily. (Patient not taking: Reported on 06/26/2015) 45 g 3   No current facility-administered medications on file prior to visit.    No Known Allergies  Family History  Problem Relation Age of Onset  . Thyroid disease Maternal Grandmother     Social History   Social History  . Marital Status: Married    Spouse Name: N/A  . Number of Children: N/A  . Years of Education: N/A   Social History Main Topics  . Smoking status: Former Smoker -- 25 years    Types: Cigarettes    Quit date: 07/06/2005  . Smokeless tobacco: Never Used  . Alcohol Use: None  . Drug Use: None  . Sexual Activity: Not Asked   Other Topics Concern  . None   Social History Narrative    Review of Systems - See HPI.  All other ROS are negative.  BP 140/88 mmHg  Pulse 92  Temp(Src) 98.3 F (36.8 C) (Oral)  Ht 5\' 8"  (1.727 m)  Wt 197 lb 3.2 oz (89.449 kg)  BMI 29.99 kg/m2  SpO2 98%  LMP 06/26/2015  Physical Exam  Constitutional: She is oriented to person, place, and time and well-developed, well-nourished, and in no distress.  HENT:  Head: Normocephalic and atraumatic.  Right Ear: External ear normal.  Left Ear: External ear normal.  Mouth/Throat: Oropharynx  is clear and moist.  + swollen and red turbinates. + TTP maxillary sinuses  Eyes: Conjunctivae are normal.  Neck: Neck supple.  Cardiovascular: Normal rate, regular rhythm, normal heart sounds and intact distal pulses.   Pulmonary/Chest: Effort normal and breath sounds normal. No respiratory distress. She has no wheezes. She has no rales. She exhibits no tenderness.  Neurological: She is alert and oriented to person, place, and time.  Skin: Skin is warm and dry. No rash noted.  Psychiatric: Affect normal.  Vitals reviewed.   Recent Results (from the past 2160 hour(s))  TSH     Status: None   Collection Time: 04/01/15 11:51 AM  Result Value Ref Range   TSH 2.91 0.35 - 4.50 uIU/mL  T3, free     Status: None   Collection Time: 04/01/15 11:51 AM  Result Value Ref Range   T3, Free 3.1 2.3 - 4.2 pg/mL  T4, free     Status: None   Collection Time: 04/01/15 11:51 AM  Result Value Ref Range   Free T4 0.93 0.60 - 1.60 ng/dL  Thyroid peroxidase antibody     Status: Abnormal   Collection Time: 04/04/15  3:55 PM  Result Value Ref Range   Thyroperoxidase Ab SerPl-aCnc 60 (H) <9 IU/mL  CK (Creatine Kinase)     Status: None   Collection Time: 04/04/15  3:55 PM  Result Value Ref Range   Total CK 87 7 - 177 U/L  Sedimentation rate     Status: None   Collection Time: 04/10/15 11:00 AM  Result Value Ref Range   Sed Rate 16 0 - 22 mm/hr    Assessment/Plan: Acute bacterial sinusitis Rx Augmentin.  Increase fluids.  Rest.  Saline nasal spray.  Probiotic.  Mucinex as directed.  Humidifier in bedroom. Flonase as directed.  Call or return to clinic if symptoms are not improving.

## 2015-06-26 NOTE — Assessment & Plan Note (Signed)
Rx Augmentin.  Increase fluids.  Rest.  Saline nasal spray.  Probiotic.  Mucinex as directed.  Humidifier in bedroom. Flonase as directed.  Call or return to clinic if symptoms are not improving.  

## 2015-06-26 NOTE — Patient Instructions (Signed)
Please take antibiotic as directed.  Increase fluid intake.  Use Saline nasal spray.  Take a daily multivitamin. Get some Flonase to use daily. I would also recommend Delsym over-the-counter for cough.  Place a humidifier in the bedroom.  Please call or return clinic if symptoms are not improving.  Sinusitis Sinusitis is redness, soreness, and swelling (inflammation) of the paranasal sinuses. Paranasal sinuses are air pockets within the bones of your face (beneath the eyes, the middle of the forehead, or above the eyes). In healthy paranasal sinuses, mucus is able to drain out, and air is able to circulate through them by way of your nose. However, when your paranasal sinuses are inflamed, mucus and air can become trapped. This can allow bacteria and other germs to grow and cause infection. Sinusitis can develop quickly and last only a short time (acute) or continue over a long period (chronic). Sinusitis that lasts for more than 12 weeks is considered chronic.  CAUSES  Causes of sinusitis include:  Allergies.  Structural abnormalities, such as displacement of the cartilage that separates your nostrils (deviated septum), which can decrease the air flow through your nose and sinuses and affect sinus drainage.  Functional abnormalities, such as when the small hairs (cilia) that line your sinuses and help remove mucus do not work properly or are not present. SYMPTOMS  Symptoms of acute and chronic sinusitis are the same. The primary symptoms are pain and pressure around the affected sinuses. Other symptoms include:  Upper toothache.  Earache.  Headache.  Bad breath.  Decreased sense of smell and taste.  A cough, which worsens when you are lying flat.  Fatigue.  Fever.  Thick drainage from your nose, which often is green and may contain pus (purulent).  Swelling and warmth over the affected sinuses. DIAGNOSIS  Your caregiver will perform a physical exam. During the exam, your  caregiver may:  Look in your nose for signs of abnormal growths in your nostrils (nasal polyps).  Tap over the affected sinus to check for signs of infection.  View the inside of your sinuses (endoscopy) with a special imaging device with a light attached (endoscope), which is inserted into your sinuses. If your caregiver suspects that you have chronic sinusitis, one or more of the following tests may be recommended:  Allergy tests.  Nasal culture A sample of mucus is taken from your nose and sent to a lab and screened for bacteria.  Nasal cytology A sample of mucus is taken from your nose and examined by your caregiver to determine if your sinusitis is related to an allergy. TREATMENT  Most cases of acute sinusitis are related to a viral infection and will resolve on their own within 10 days. Sometimes medicines are prescribed to help relieve symptoms (pain medicine, decongestants, nasal steroid sprays, or saline sprays).  However, for sinusitis related to a bacterial infection, your caregiver will prescribe antibiotic medicines. These are medicines that will help kill the bacteria causing the infection.  Rarely, sinusitis is caused by a fungal infection. In theses cases, your caregiver will prescribe antifungal medicine. For some cases of chronic sinusitis, surgery is needed. Generally, these are cases in which sinusitis recurs more than 3 times per year, despite other treatments. HOME CARE INSTRUCTIONS   Drink plenty of water. Water helps thin the mucus so your sinuses can drain more easily.  Use a humidifier.  Inhale steam 3 to 4 times a day (for example, sit in the bathroom with the shower running).  Apply a warm, moist washcloth to your face 3 to 4 times a day, or as directed by your caregiver.  Use saline nasal sprays to help moisten and clean your sinuses.  Take over-the-counter or prescription medicines for pain, discomfort, or fever only as directed by your caregiver. SEEK  IMMEDIATE MEDICAL CARE IF:  You have increasing pain or severe headaches.  You have nausea, vomiting, or drowsiness.  You have swelling around your face.  You have vision problems.  You have a stiff neck.  You have difficulty breathing. MAKE SURE YOU:   Understand these instructions.  Will watch your condition.  Will get help right away if you are not doing well or get worse. Document Released: 06/22/2005 Document Revised: 09/14/2011 Document Reviewed: 07/07/2011 [facility] Patient Information 2014 [place], [place].

## 2015-06-26 NOTE — Progress Notes (Signed)
Pre visit review using our clinic review tool, if applicable. No additional management support is needed unless otherwise documented below in the visit note. 

## 2015-06-28 ENCOUNTER — Encounter: Payer: Self-pay | Admitting: Physician Assistant

## 2015-08-03 ENCOUNTER — Other Ambulatory Visit: Payer: Self-pay | Admitting: Family Medicine

## 2015-08-05 NOTE — Telephone Encounter (Signed)
Metrogel last prescribed by [place] in 2015. Ok to refill?

## 2015-08-07 ENCOUNTER — Telehealth: Payer: Self-pay | Admitting: Family Medicine

## 2015-08-07 NOTE — Telephone Encounter (Signed)
Patient declined flu shot  °

## 2015-08-08 ENCOUNTER — Ambulatory Visit (INDEPENDENT_AMBULATORY_CARE_PROVIDER_SITE_OTHER): Payer: BLUE CROSS/BLUE SHIELD | Admitting: Family Medicine

## 2015-08-08 ENCOUNTER — Encounter: Payer: Self-pay | Admitting: Family Medicine

## 2015-08-08 VITALS — BP 138/82 | HR 76 | Temp 98.2°F | Ht 68.0 in | Wt 200.6 lb

## 2015-08-08 DIAGNOSIS — E039 Hypothyroidism, unspecified: Secondary | ICD-10-CM | POA: Diagnosis not present

## 2015-08-08 DIAGNOSIS — E559 Vitamin D deficiency, unspecified: Secondary | ICD-10-CM | POA: Diagnosis not present

## 2015-08-08 LAB — T4, FREE: Free T4: 0.89 ng/dL (ref 0.60–1.60)

## 2015-08-08 LAB — TSH: TSH: 1.27 u[IU]/mL (ref 0.35–4.50)

## 2015-08-08 LAB — T3, FREE: T3, Free: 3.2 pg/mL (ref 2.3–4.2)

## 2015-08-08 LAB — VITAMIN D 25 HYDROXY (VIT D DEFICIENCY, FRACTURES): VITD: 36.49 ng/mL (ref 30.00–100.00)

## 2015-08-08 NOTE — Progress Notes (Signed)
   Subjective:    Patient ID: Stephanie Lynch, female    DOB: [birth year], 45 y.o.   MRN: [id number]  HPI Hypothyroid- pt realized she has not had repeat thyroid labs after meds were adjusted.  Pt reports hair is thinner than previous.  Vit D deficiency- pt would like repeat Vit D levels.  Pt is currently taking Vit D 2500 daily.  Felt much better when taking 4,000 units daily.  + fatigue, body aches.   Review of Systems For ROS see HPI     Objective:   Physical Exam  Constitutional: She is oriented to person, place, and time. She appears well-developed and well-nourished. No distress.  HENT:  Head: Normocephalic and atraumatic.  Eyes: Conjunctivae and EOM are normal. Pupils are equal, round, and reactive to light.  Neck: Normal range of motion. Neck supple. No thyromegaly present.  Neurological: She is alert and oriented to person, place, and time.  Skin: Skin is warm and dry.  Psychiatric: She has a normal mood and affect. Her behavior is normal. Thought content normal.  Vitals reviewed.         Assessment & Plan:

## 2015-08-08 NOTE — Assessment & Plan Note (Signed)
Pt has hx of this.  Decreased her dose recently and again having fatigue, muscle aches, joint pain.  Encouraged her to increase to 5,000 units daily.  Repeat Vit D level today and determine if 50,000 units weekly is needed.

## 2015-08-08 NOTE — Patient Instructions (Signed)
Schedule your complete physical for July We'll notify you of your lab results and make any changes if needed Try and make healthy food choices and get regular exercise- you can do it! Restart the Vit D 5,000 units daily Call with any questions or concerns Hang in there!!!

## 2015-08-08 NOTE — Progress Notes (Signed)
Pre visit review using our clinic review tool, if applicable. No additional management support is needed unless otherwise documented below in the visit note. 

## 2015-08-08 NOTE — Assessment & Plan Note (Signed)
Chronic problem.  Pt is again having fatigue.  Did not have f/u labs after adjusting medication dose.  Here today for repeat TSH, free T3/T4.  Will adjust meds prn.

## 2015-08-27 ENCOUNTER — Encounter: Payer: Self-pay | Admitting: Family Medicine

## 2015-09-18 ENCOUNTER — Encounter: Payer: Self-pay | Admitting: Family Medicine

## 2015-09-18 DIAGNOSIS — M722 Plantar fascial fibromatosis: Secondary | ICD-10-CM

## 2015-09-18 NOTE — Telephone Encounter (Signed)
Referral placed.

## 2015-09-20 ENCOUNTER — Ambulatory Visit (INDEPENDENT_AMBULATORY_CARE_PROVIDER_SITE_OTHER): Payer: BLUE CROSS/BLUE SHIELD | Admitting: Family Medicine

## 2015-09-20 ENCOUNTER — Encounter: Payer: Self-pay | Admitting: Family Medicine

## 2015-09-20 VITALS — BP 140/91 | HR 82 | Ht 68.0 in | Wt 195.0 lb

## 2015-09-20 DIAGNOSIS — M722 Plantar fascial fibromatosis: Secondary | ICD-10-CM | POA: Diagnosis not present

## 2015-09-20 NOTE — Patient Instructions (Signed)
You have plantar fasciitis Take tylenol or aleve as needed for pain  Plantar fascia stretch for 20-30 seconds (do 3 of these) in morning Lowering/raise on a step exercises 3 x 10 once or twice a day - this is very important for long term recovery. Can add heel walks, toe walks forward and backward as well Ice heel for 15 minutes as needed. Avoid flat shoes/barefoot walking as much as possible. Arch straps have been shown to help with pain. Continue your inserts for arch support. Steroid injection is a consideration for short term pain relief if you are struggling. Physical therapy is also an option. Other considerations: night splints, massage, active release. Follow up with me in 6 weeks for reevaluation.

## 2015-09-26 DIAGNOSIS — M722 Plantar fascial fibromatosis: Secondary | ICD-10-CM | POA: Insufficient documentation

## 2015-09-26 NOTE — Assessment & Plan Note (Signed)
Has some inserts with very good arch support - encouraged to continue with these.  Icing, nsaids/tylenol if needed.  Reviewed home exercises and stretches for her to do daily.  Arch binders.  Consider injection, physical therapy if not improving.  F/u in 6 weeks.

## 2015-09-26 NOTE — Progress Notes (Signed)
PCP: Neena RhymesKatherine Tabori, MD  Subjective:   HPI: Patient is a 45 y.o. female here for bilateral foot pain.  Patient reports she's had problems with plantar heel pain bilaterally since about November 2016. Walks on hardwood floors at home without shoes. Pain worse in morning and after walking barefoot. Pain level up to 6/10, sharp. No skin changes, fever, other complaints.  Past Medical History  Diagnosis Date  . Thyroid disease     Current Outpatient Prescriptions on File Prior to Visit  Medication Sig Dispense Refill  . cholecalciferol (VITAMIN D) 1000 UNITS tablet Take 2,000 Units by mouth daily.    Marland Kitchen. levothyroxine (SYNTHROID, LEVOTHROID) 75 MCG tablet Take 1 tablet (75 mcg total) by mouth daily. 30 tablet 6  . magnesium oxide (MAG-OX) 400 MG tablet Take 400 mg by mouth daily.    . metroNIDAZOLE (METROGEL) 0.75 % gel APPLY ONE APPLICATION  TOPICALLY TWICE DAILY 45 g 3  . SPRINTEC 28 0.25-35 MG-MCG tablet TAKE ONE TABLET BY MOUTH ONCE DAILY 28 tablet 12   No current facility-administered medications on file prior to visit.    No past surgical history on file.  No Known Allergies  Social History   Social History  . Marital Status: Married    Spouse Name: N/A  . Number of Children: N/A  . Years of Education: N/A   Occupational History  . Not on file.   Social History Main Topics  . Smoking status: Former Smoker -- 25 years    Types: Cigarettes    Quit date: 07/06/2005  . Smokeless tobacco: Never Used  . Alcohol Use: Not on file  . Drug Use: Not on file  . Sexual Activity: Not on file   Other Topics Concern  . Not on file   Social History Narrative    Family History  Problem Relation Age of Onset  . Thyroid disease Maternal Grandmother     BP 140/91 mmHg  Pulse 82  Ht 5\' 8"  (1.727 m)  Wt 195 lb (88.451 kg)  BMI 29.66 kg/m2  Review of Systems: See HPI above.    Objective:  Physical Exam:  Gen: NAD, comfortable in exam room  Bilateral  feet/ankles: Overpronation. No other gross deformity, swelling, ecchymoses FROM ankles without pain. TTP plantar fascia at insertion on calcanei medially. Negative ant drawer and talar tilt.   Negative syndesmotic compression. Negative calcaneal squeeze. Thompsons test negative. NV intact distally.    Assessment & Plan:  1. Bilateral plantar fasciitis - Has some inserts with very good arch support - encouraged to continue with these.  Icing, nsaids/tylenol if needed.  Reviewed home exercises and stretches for her to do daily.  Arch binders.  Consider injection, physical therapy if not improving.  F/u in 6 weeks.

## 2015-10-15 ENCOUNTER — Encounter: Payer: Self-pay | Admitting: Family Medicine

## 2015-10-22 MED ORDER — AZELAIC ACID 15 % EX GEL: CUTANEOUS | Status: DC

## 2015-10-28 ENCOUNTER — Other Ambulatory Visit: Payer: Self-pay | Admitting: General Practice

## 2015-10-28 MED ORDER — AZELAIC ACID 15 % EX GEL: CUTANEOUS | Status: DC

## 2015-11-04 ENCOUNTER — Ambulatory Visit (INDEPENDENT_AMBULATORY_CARE_PROVIDER_SITE_OTHER): Payer: BLUE CROSS/BLUE SHIELD | Admitting: Family Medicine

## 2015-11-04 ENCOUNTER — Encounter: Payer: Self-pay | Admitting: Family Medicine

## 2015-11-04 VITALS — BP 146/86 | HR 99 | Ht 67.0 in | Wt 195.0 lb

## 2015-11-04 DIAGNOSIS — M722 Plantar fascial fibromatosis: Secondary | ICD-10-CM

## 2015-11-04 MED ORDER — METHYLPREDNISOLONE ACETATE 40 MG/ML IJ SUSP
40.0000 mg | Freq: Once | INTRAMUSCULAR | Status: AC
  Administered 2015-11-04: 40 mg via INTRA_ARTICULAR

## 2015-11-04 NOTE — Addendum Note (Signed)
Addended by: Kathi SimpersWISE, Cobie Leidner F on: 11/04/2015 02:28 PM   Modules accepted: Orders

## 2015-11-04 NOTE — Progress Notes (Signed)
PCP: Neena Rhymes, MD  Subjective:   HPI: Patient is a 45 y.o. female here for bilateral foot pain.  3/17: Patient reports she's had problems with plantar heel pain bilaterally since about November 2016. Walks on hardwood floors at home without shoes. Pain worse in morning and after walking barefoot. Pain level up to 6/10, sharp. No skin changes, fever, other complaints.  5/1: Patient reports she is still having pain to 5-6/10 level, constant dull ache plantar heel. Better with tennis shoes or something with an arch. Using insoles and doing home exercise program. Pain is 0/10 at rest. Worse with walking and first thing in the morning. Tried arch binders without much help. No skin changes, numbness.  Past Medical History  Diagnosis Date  . Thyroid disease     Current Outpatient Prescriptions on File Prior to Visit  Medication Sig Dispense Refill  . Azelaic Acid 15 % cream After skin is thoroughly washed and patted dry, gently but thoroughly massage a thin film of azelaic acid cream into the affected area twice daily, in the morning and evening. 50 g 3  . cholecalciferol (VITAMIN D) 1000 UNITS tablet Take 2,000 Units by mouth daily.    Marland Kitchen levothyroxine (SYNTHROID, LEVOTHROID) 75 MCG tablet Take 1 tablet (75 mcg total) by mouth daily. 30 tablet 6  . magnesium oxide (MAG-OX) 400 MG tablet Take 400 mg by mouth daily.    . metroNIDAZOLE (METROGEL) 0.75 % gel APPLY ONE APPLICATION  TOPICALLY TWICE DAILY 45 g 3  . SPRINTEC 28 0.25-35 MG-MCG tablet TAKE ONE TABLET BY MOUTH ONCE DAILY 28 tablet 12   No current facility-administered medications on file prior to visit.    No past surgical history on file.  No Known Allergies  Social History   Social History  . Marital Status: Married    Spouse Name: N/A  . Number of Children: N/A  . Years of Education: N/A   Occupational History  . Not on file.   Social History Main Topics  . Smoking status: Former Smoker -- 25 years    Types: Cigarettes    Quit date: 07/06/2005  . Smokeless tobacco: Never Used  . Alcohol Use: Not on file  . Drug Use: Not on file  . Sexual Activity: Not on file   Other Topics Concern  . Not on file   Social History Narrative    Family History  Problem Relation Age of Onset  . Thyroid disease Maternal Grandmother     BP 146/86 mmHg  Pulse 99  Ht  (1.702 m)  Wt 195 lb (88.451 kg)  BMI 30.53 kg/m2  Review of Systems: See HPI above.    Objective:  Physical Exam:  Gen: NAD, comfortable in exam room  Bilateral feet/ankles: Overpronation. No other gross deformity, swelling, ecchymoses FROM ankles without pain. TTP plantar fascia at insertion on calcanei medially. Negative ant drawer and talar tilt.   Negative syndesmotic compression. Negative calcaneal squeeze. Thompsons test negative. NV intact distally.    Assessment & Plan:  1. Bilateral plantar fasciitis - Continue with arch support, home exercises, icing, nsaids/tylenol if needed.  We discussed options and she chose to do injections today.  Consider physical therapy, night splints, massage, active release.  F/u in 6 weeks.  After informed written consent patient was seated on exam table.  Area overlying left plantar fascia insertion on calcaneus prepped with alcohol swab.  Then using ultrasound guidance left plantar fascia was injected with 2:1 marcaine: depomedrol with several needle  fenestrations.  Patient tolerated procedure well without immediate complications.  After informed written consent patient was seated on exam table.  Area overlying right plantar fascia insertion on calcaneus prepped with alcohol swab.  Then using ultrasound guidance right plantar fascia was injected with 2:1 marcaine: depomedrol with several needle fenestrations.  Patient tolerated procedure well without immediate complications.

## 2015-11-04 NOTE — Assessment & Plan Note (Signed)
Continue with arch support, home exercises, icing, nsaids/tylenol if needed.  We discussed options and she chose to do injections today.  Consider physical therapy, night splints, massage, active release.  F/u in 6 weeks.  After informed written consent patient was seated on exam table.  Area overlying left plantar fascia insertion on calcaneus prepped with alcohol swab.  Then using ultrasound guidance left plantar fascia was injected with 2:1 marcaine: depomedrol with several needle fenestrations.  Patient tolerated procedure well without immediate complications.  After informed written consent patient was seated on exam table.  Area overlying right plantar fascia insertion on calcaneus prepped with alcohol swab.  Then using ultrasound guidance right plantar fascia was injected with 2:1 marcaine: depomedrol with several needle fenestrations.  Patient tolerated procedure well without immediate complications.

## 2015-11-04 NOTE — Patient Instructions (Signed)
You have plantar fasciitis Take tylenol or aleve as needed for pain  Plantar fascia stretch for 20-30 seconds (do 3 of these) in morning Lowering/raise on a step exercises 3 x 10 once or twice a day - this is very important for long term recovery. Can add heel walks, toe walks forward and backward as well Ice heel for 15 minutes as needed. Avoid flat shoes/barefoot walking as much as possible. Arch straps have been shown to help with pain. Continue your inserts for arch support. You were given cortisone injections today. Consider physical therapy, night splints, massage, active release. Follow up with me in 6 weeks for reevaluation but can call sooner if you want to do physical therapy.

## 2015-11-15 ENCOUNTER — Other Ambulatory Visit: Payer: Self-pay | Admitting: Family Medicine

## 2015-11-15 NOTE — Telephone Encounter (Signed)
Medication filled to pharmacy as requested.   

## 2015-12-13 IMAGING — US US SOFT TISSUE HEAD/NECK
1 series · 13 of 25 positions shown · non-contrast
Comparison: None.

CLINICAL DATA: Right sided thyromegaly for 3 days.  Hypothyroidism.

EXAM:
THYROID ULTRASOUND
TECHNIQUE: Ultrasound examination of the thyroid gland and adjacent soft
tissues was performed.

[Series 1: us soft tissue head/neck · 0.06mm/px · 13 of 31 slices shown]
[im 1/31]
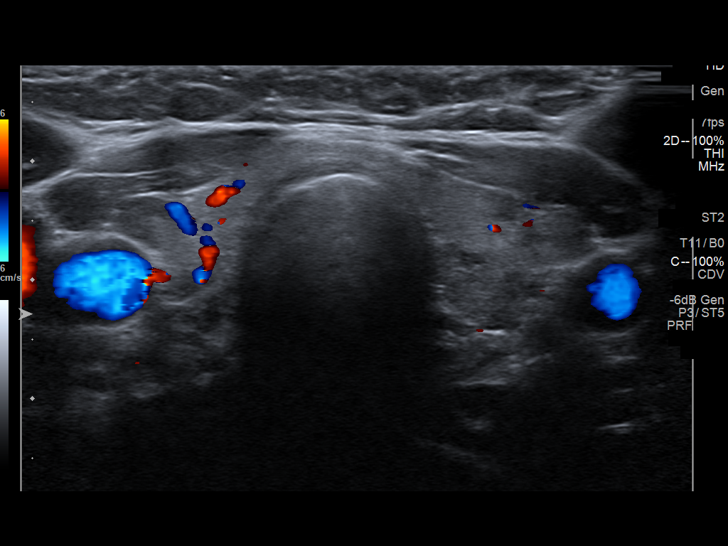
[im 3/31]
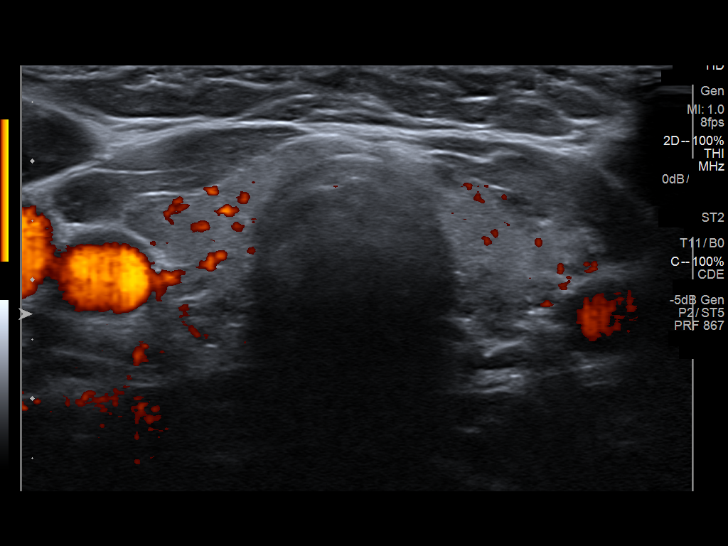
[im 6/31]
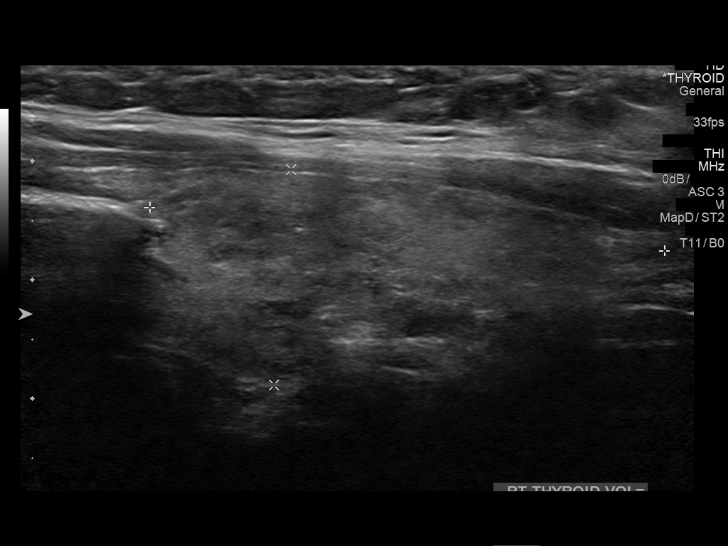
[im 8/31]
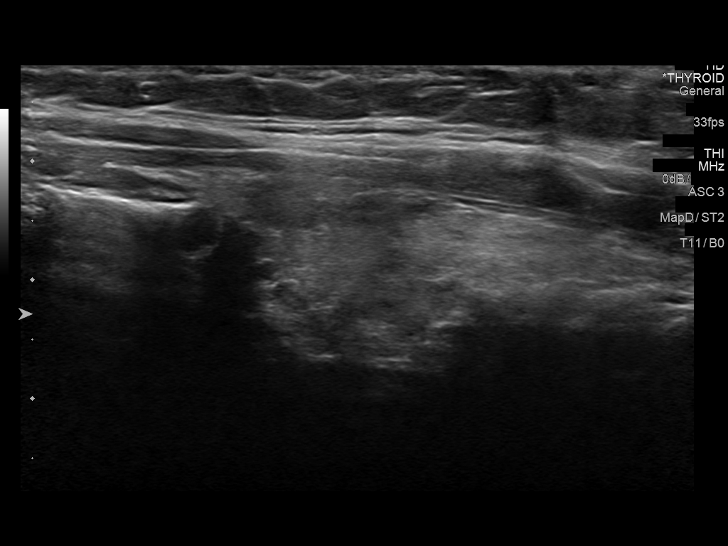
[im 11/31]
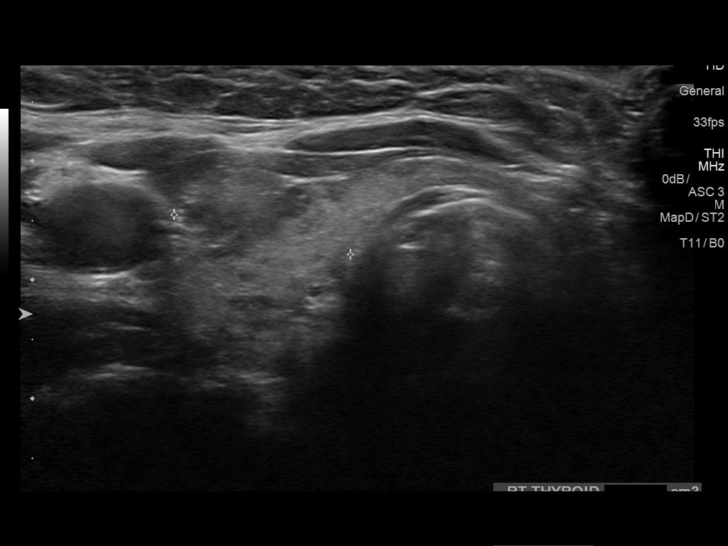
[im 13/31]
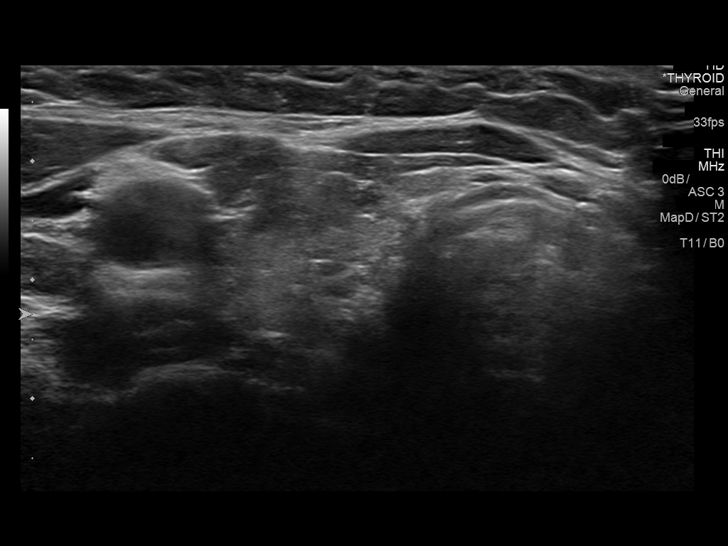
[im 16/31]
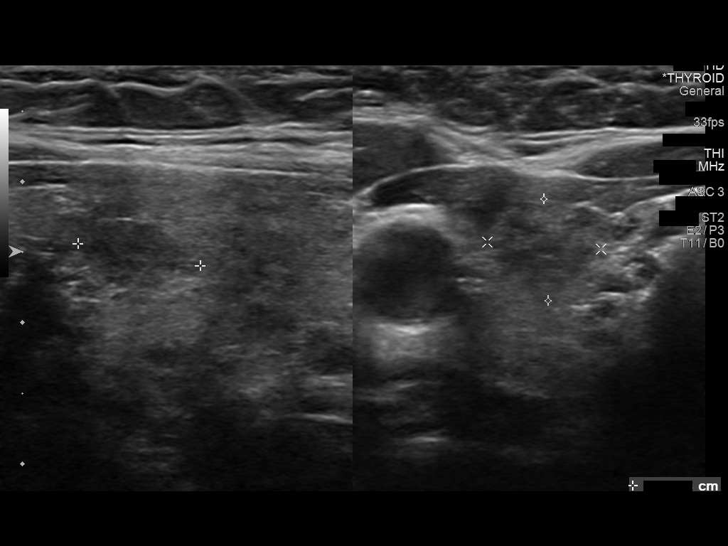
[im 18/31]
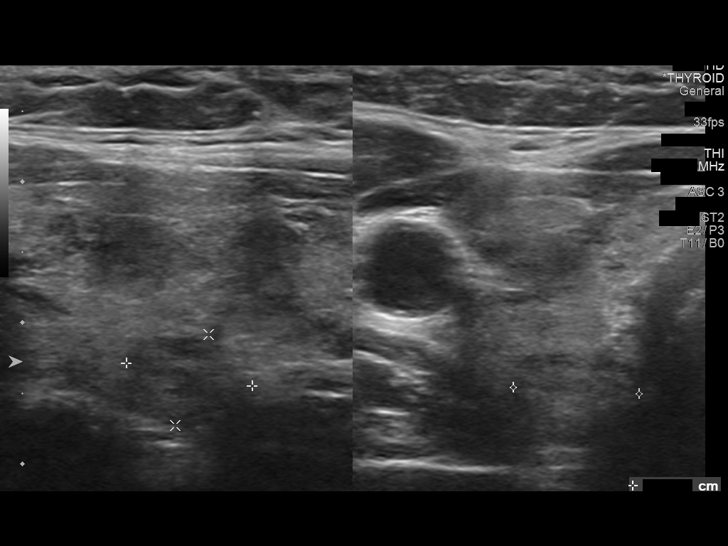
[im 21/31]
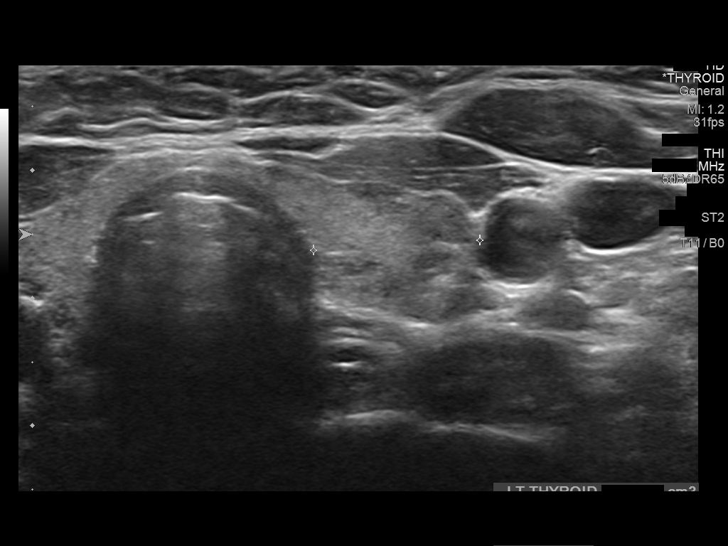
[im 23/31]
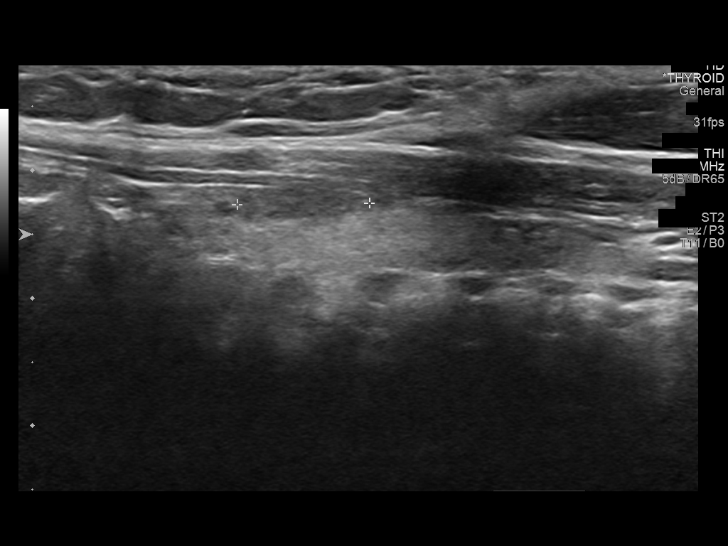
[im 26/31]
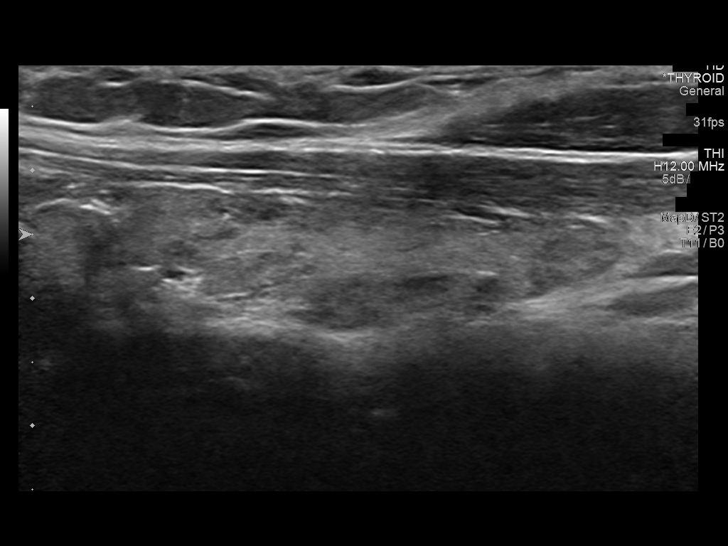
[im 28/31]
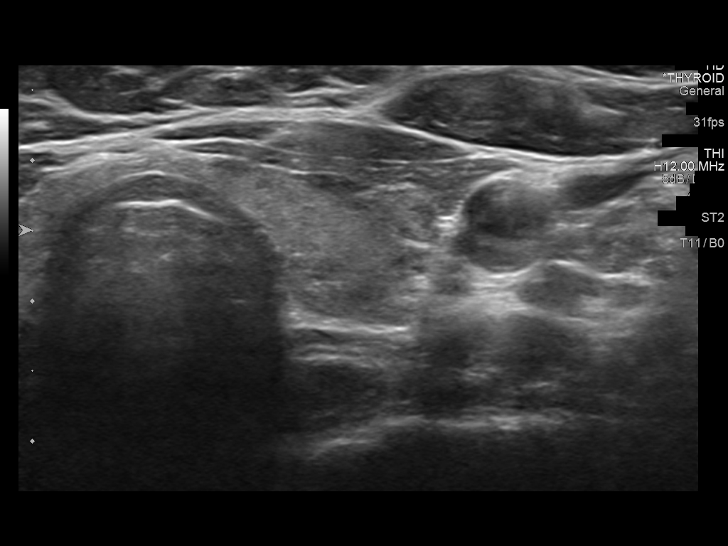
[im 31/31]
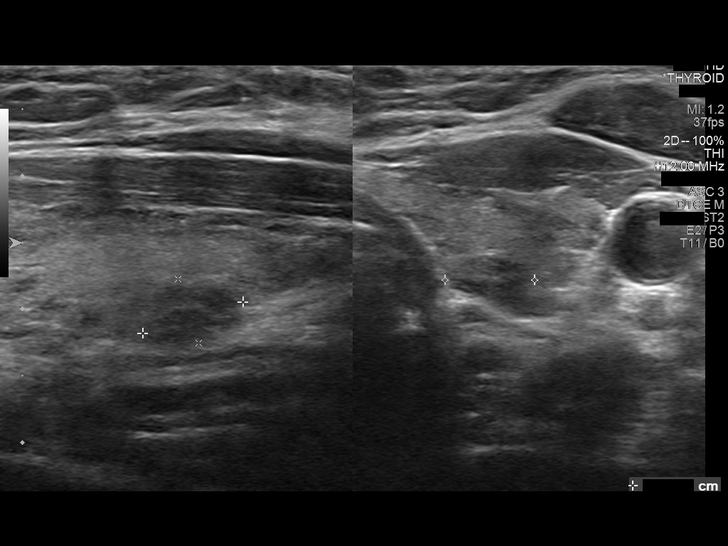

[13 of 25 positions shown; findings below may reference images not displayed]

FINDINGS: Right thyroid lobe

Measurements: 5.2 x 1.8 x 1.5 cm. Right thyroid tissue is diffusely
heterogeneous. Subtle nodule or pseudo nodule in superior right
thyroid lobe measures 0.9 x 0.8 x 0.7 cm. There is a second nodular
area measuring up to 0.7 cm. There is a third nodular area in the
posterior right lobe measuring 0.9 x 0.9 x 0.7 cm.

Left thyroid lobe

Measurements: 4.2 x 1.1 x 1.3 cm. Left lobe is heterogeneous. Subtle
nodule or pseudo nodule in the posterior left thyroid lobe measuring
up to 0.4 cm. There is another subtle nodular structure measuring up
to 0.8 cm.

Isthmus

Thickness: 0.3 cm.  No nodules visualized.

Lymphadenopathy

None visualized.
IMPRESSION: Thyroid tissue is diffusely heterogeneous, right side greater the
left. Heterogeneity could be related to thyroiditis based on the new
onset of thyromegaly.

Bilateral subtle nodules or pseudo nodules. Largest nodule measures
up to 0.9 cm. Findings do not meet current SRU consensus criteria
for biopsy. Follow-up by clinical exam is recommended. If patient
has known risk factors for thyroid carcinoma, consider follow-up
ultrasound in 12 months. If patient is clinically hyperthyroid,
consider nuclear medicine thyroid uptake and scan.
Reference: Management of Thyroid Nodules Detected at US: Society of
Radiologists in Ultrasound Consensus Conference Statement. Radiology

## 2015-12-20 ENCOUNTER — Other Ambulatory Visit: Payer: Self-pay | Admitting: Family Medicine

## 2015-12-20 DIAGNOSIS — Z1231 Encounter for screening mammogram for malignant neoplasm of breast: Secondary | ICD-10-CM

## 2015-12-26 ENCOUNTER — Ambulatory Visit
Admission: RE | Admit: 2015-12-26 | Discharge: 2015-12-26 | Disposition: A | Discharge status: Home / Self Care | Payer: BLUE CROSS/BLUE SHIELD | Source: Ambulatory Visit | Attending: Family Medicine | Admitting: Family Medicine

## 2015-12-26 DIAGNOSIS — Z1231 Encounter for screening mammogram for malignant neoplasm of breast: Secondary | ICD-10-CM

## 2016-01-22 ENCOUNTER — Other Ambulatory Visit: Payer: Self-pay | Admitting: General Practice

## 2016-01-22 MED ORDER — NORGESTIMATE-ETH ESTRADIOL 0.25-35 MG-MCG PO TABS: 1.0000 | ORAL_TABLET | Freq: Every day | ORAL | Status: DC

## 2016-01-29 ENCOUNTER — Other Ambulatory Visit: Payer: Self-pay | Admitting: General Practice

## 2016-01-29 MED ORDER — NORGESTIMATE-ETH ESTRADIOL 0.25-35 MG-MCG PO TABS: 1.0000 | ORAL_TABLET | Freq: Every day | ORAL | 11 refills | Status: DC

## 2016-06-11 ENCOUNTER — Encounter: Payer: Self-pay | Admitting: Family Medicine

## 2016-06-11 ENCOUNTER — Ambulatory Visit (INDEPENDENT_AMBULATORY_CARE_PROVIDER_SITE_OTHER): Payer: BLUE CROSS/BLUE SHIELD | Admitting: Family Medicine

## 2016-06-11 ENCOUNTER — Other Ambulatory Visit: Payer: Self-pay | Admitting: General Practice

## 2016-06-11 VITALS — BP 123/78 | HR 94 | Temp 98.4°F | Resp 17 | Ht 67.0 in | Wt 197.2 lb

## 2016-06-11 DIAGNOSIS — J019 Acute sinusitis, unspecified: Secondary | ICD-10-CM | POA: Diagnosis not present

## 2016-06-11 DIAGNOSIS — B9689 Other specified bacterial agents as the cause of diseases classified elsewhere: Secondary | ICD-10-CM

## 2016-06-11 MED ORDER — AMOXICILLIN 875 MG PO TABS: 875.0000 mg | ORAL_TABLET | Freq: Two times a day (BID) | ORAL | 0 refills | Status: DC

## 2016-06-11 MED ORDER — METRONIDAZOLE 0.75 % EX GEL: 1.0000 "application " | Freq: Two times a day (BID) | CUTANEOUS | 1 refills | Status: DC

## 2016-06-11 NOTE — Assessment & Plan Note (Signed)
Pt's sxs and PE consistent w/ infxn.  Start abx.  Reviewed supportive care and red flags that should prompt return.  Pt expressed understanding and is in agreement w/ plan.  

## 2016-06-11 NOTE — Progress Notes (Signed)
   Subjective:    Patient ID: Stephanie CousinKristen M Lynch, female    DOB: 1971-05-16, 45 y.o.   MRN: [id number]  HPI URI- pt reports sxs started yesterday.  Tm 'mid 100'.  + facial pain, tooth pain, nasal congestion.  Fever resolved spontaneously.  No ear pain, 'i have fluid in my ear'.  No cough.  + hoarseness.  No known sick contacts.   Review of Systems For ROS see HPI     Objective:   Physical Exam  Constitutional: She is oriented to person, place, and time. She appears well-developed and well-nourished. No distress.  HENT:  Head: Normocephalic and atraumatic.  Right Ear: Tympanic membrane normal.  Left Ear: Tympanic membrane normal.  Nose: Mucosal edema and rhinorrhea present. Right sinus exhibits maxillary sinus tenderness and frontal sinus tenderness. Left sinus exhibits maxillary sinus tenderness and frontal sinus tenderness.  Mouth/Throat: Uvula is midline and mucous membranes are normal. Posterior oropharyngeal erythema present. No oropharyngeal exudate.  Eyes: Conjunctivae and EOM are normal. Pupils are equal, round, and reactive to light.  Neck: Normal range of motion. Neck supple.  Cardiovascular: Normal rate, regular rhythm and normal heart sounds.   Pulmonary/Chest: Effort normal and breath sounds normal. No respiratory distress. She has no wheezes.  Lymphadenopathy:    She has no cervical adenopathy.  Neurological: She is alert and oriented to person, place, and time.  Skin: Skin is warm and dry.  Psychiatric: She has a normal mood and affect. Her behavior is normal. Thought content normal.  Vitals reviewed.         Assessment & Plan:

## 2016-06-11 NOTE — Patient Instructions (Signed)
Follow up as needed Start the Amoxicillin twice daily- take w/ food Drink plenty of fluids REST! Call with any questions or concerns Hang in there!! Happy Holidays!!! 

## 2016-06-11 NOTE — Progress Notes (Signed)
Pre visit review using our clinic review tool, if applicable. No additional management support is needed unless otherwise documented below in the visit note. 

## 2016-06-27 ENCOUNTER — Telehealth: Payer: BLUE CROSS/BLUE SHIELD | Admitting: Nurse Practitioner

## 2016-06-27 DIAGNOSIS — R059 Cough, unspecified: Secondary | ICD-10-CM

## 2016-06-27 DIAGNOSIS — R05 Cough: Secondary | ICD-10-CM

## 2016-06-27 DIAGNOSIS — J0101 Acute recurrent maxillary sinusitis: Secondary | ICD-10-CM

## 2016-06-27 MED ORDER — AZITHROMYCIN 250 MG PO TABS
ORAL_TABLET | ORAL | 0 refills | Status: DC
Start: 2016-06-27 — End: 2016-08-11

## 2016-06-27 NOTE — Progress Notes (Signed)
We are sorry that you are not feeling well.  Here is how we plan to help!  Based on what you have shared with me it looks like you have upper respiratory tract inflammation that has resulted in a significant cough.  Inflammation and infection in the upper respiratory tract is commonly called bronchitis and has four common causes:  Allergies, Viral Infections, Acid Reflux and Bacterial Infections.  Allergies, viruses and acid reflux are treated by controlling symptoms or eliminating the cause. An example might be a cough caused by taking certain blood pressure medications. You stop the cough by changing the medication. Another example might be a cough caused by acid reflux. Controlling the reflux helps control the cough.  Based on your presentation I believe you most likely have A cough due to bacteria.  When patients have a fever and a productive cough with a change in color or increased sputum production, we are concerned about bacterial bronchitis.  If left untreated it can progress to pneumonia.  If your symptoms do not improve with your treatment plan it is important that you contact your provider.   I have prescribed Azithromyin 250 mg: two tables now and then one tablet daily for 4 additonal days    In addition you may use A non-prescription cough medication called Robitussin DAC. Take 2 teaspoons every 8 hours or Delsym: take 2 teaspoons every 12 hours.    USE OF BRONCHODILATOR ("RESCUE") INHALERS: There is a risk from using your bronchodilator too frequently.  The risk is that over-reliance on a medication which only relaxes the muscles surrounding the breathing tubes can reduce the effectiveness of medications prescribed to reduce swelling and congestion of the tubes themselves.  Although you feel brief relief from the bronchodilator inhaler, your asthma may actually be worsening with the tubes becoming more swollen and filled with mucus.  This can delay other crucial treatments, such as oral  steroid medications. If you need to use a bronchodilator inhaler daily, several times per day, you should discuss this with your provider.  There are probably better treatments that could be used to keep your asthma under control.     HOME CARE . Only take medications as instructed by your medical team. . Complete the entire course of an antibiotic. . Drink plenty of fluids and get plenty of rest. . Avoid close contacts especially the very young and the elderly . Cover your mouth if you cough or cough into your sleeve. . Always remember to wash your hands . A steam or ultrasonic humidifier can help congestion.   GET HELP RIGHT AWAY IF: . You develop worsening fever. . You become short of breath . You cough up blood. . Your symptoms persist after you have completed your treatment plan MAKE SURE YOU   Understand these instructions.  Will watch your condition.  Will get help right away if you are not doing well or get worse.  Your e-visit answers were reviewed by a board certified advanced clinical practitioner to complete your personal care plan.  Depending on the condition, your plan could have included both over the counter or prescription medications. If there is a problem please reply  once you have received a response from your provider. Your safety is important to us.  If you have drug allergies check your prescription carefully.    You can use MyChart to ask questions about today's visit, request a non-urgent call back, or ask for a work or school excuse for 24 hours   related to this e-Visit. If it has been greater than 24 hours you will need to follow up with your provider, or enter a new e-Visit to address those concerns. You will get an e-mail in the next two days asking about your experience.  I hope that your e-visit has been valuable and will speed your recovery. Thank you for using e-visits.   

## 2016-08-10 ENCOUNTER — Other Ambulatory Visit: Payer: Self-pay | Admitting: General Practice

## 2016-08-10 MED ORDER — OSELTAMIVIR PHOSPHATE 75 MG PO CAPS
75.0000 mg | ORAL_CAPSULE | Freq: Every day | ORAL | 0 refills | Status: DC
Start: 2016-08-10 — End: 2016-10-05

## 2016-08-11 ENCOUNTER — Encounter: Payer: Self-pay | Admitting: Physician Assistant

## 2016-08-11 ENCOUNTER — Encounter: Payer: Self-pay | Admitting: Family Medicine

## 2016-08-11 ENCOUNTER — Ambulatory Visit (INDEPENDENT_AMBULATORY_CARE_PROVIDER_SITE_OTHER): Payer: BLUE CROSS/BLUE SHIELD | Admitting: Physician Assistant

## 2016-08-11 VITALS — BP 122/82 | HR 89 | Temp 98.6°F | Resp 14 | Ht 67.0 in | Wt 198.6 lb

## 2016-08-11 DIAGNOSIS — J019 Acute sinusitis, unspecified: Secondary | ICD-10-CM | POA: Diagnosis not present

## 2016-08-11 DIAGNOSIS — B9689 Other specified bacterial agents as the cause of diseases classified elsewhere: Secondary | ICD-10-CM | POA: Diagnosis not present

## 2016-08-11 MED ORDER — AMOXICILLIN-POT CLAVULANATE 875-125 MG PO TABS: 1.0000 | ORAL_TABLET | Freq: Two times a day (BID) | ORAL | 0 refills | Status: DC

## 2016-08-11 NOTE — Patient Instructions (Signed)
Please take antibiotic as directed.  Increase fluid intake.  Use Saline nasal spray.  Take a daily multivitamin. Start daily antihistamine --- recommend Xyzal.  Place a humidifier in the bedroom.  Please call or return clinic if symptoms are not improving.  Sinusitis Sinusitis is redness, soreness, and swelling (inflammation) of the paranasal sinuses. Paranasal sinuses are air pockets within the bones of your face (beneath the eyes, the middle of the forehead, or above the eyes). In healthy paranasal sinuses, mucus is able to drain out, and air is able to circulate through them by way of your nose. However, when your paranasal sinuses are inflamed, mucus and air can become trapped. This can allow bacteria and other germs to grow and cause infection. Sinusitis can develop quickly and last only a short time (acute) or continue over a long period (chronic). Sinusitis that lasts for more than 12 weeks is considered chronic.  CAUSES  Causes of sinusitis include:  Allergies.  Structural abnormalities, such as displacement of the cartilage that separates your nostrils (deviated septum), which can decrease the air flow through your nose and sinuses and affect sinus drainage.  Functional abnormalities, such as when the small hairs (cilia) that line your sinuses and help remove mucus do not work properly or are not present. SYMPTOMS  Symptoms of acute and chronic sinusitis are the same. The primary symptoms are pain and pressure around the affected sinuses. Other symptoms include:  Upper toothache.  Earache.  Headache.  Bad breath.  Decreased sense of smell and taste.  A cough, which worsens when you are lying flat.  Fatigue.  Fever.  Thick drainage from your nose, which often is green and may contain pus (purulent).  Swelling and warmth over the affected sinuses. DIAGNOSIS  Your caregiver will perform a physical exam. During the exam, your caregiver may:  Look in your nose for signs of  abnormal growths in your nostrils (nasal polyps).  Tap over the affected sinus to check for signs of infection.  View the inside of your sinuses (endoscopy) with a special imaging device with a light attached (endoscope), which is inserted into your sinuses. If your caregiver suspects that you have chronic sinusitis, one or more of the following tests may be recommended:  Allergy tests.  Nasal culture A sample of mucus is taken from your nose and sent to a lab and screened for bacteria.  Nasal cytology A sample of mucus is taken from your nose and examined by your caregiver to determine if your sinusitis is related to an allergy. TREATMENT  Most cases of acute sinusitis are related to a viral infection and will resolve on their own within 10 days. Sometimes medicines are prescribed to help relieve symptoms (pain medicine, decongestants, nasal steroid sprays, or saline sprays).  However, for sinusitis related to a bacterial infection, your caregiver will prescribe antibiotic medicines. These are medicines that will help kill the bacteria causing the infection.  Rarely, sinusitis is caused by a fungal infection. In theses cases, your caregiver will prescribe antifungal medicine. For some cases of chronic sinusitis, surgery is needed. Generally, these are cases in which sinusitis recurs more than 3 times per year, despite other treatments. HOME CARE INSTRUCTIONS   Drink plenty of water. Water helps thin the mucus so your sinuses can drain more easily.  Use a humidifier.  Inhale steam 3 to 4 times a day (for example, sit in the bathroom with the shower running).  Apply a warm, moist washcloth to your face  3 to 4 times a day, or as directed by your caregiver.  Use saline nasal sprays to help moisten and clean your sinuses.  Take over-the-counter or prescription medicines for pain, discomfort, or fever only as directed by your caregiver. SEEK IMMEDIATE MEDICAL CARE IF:  You have increasing  pain or severe headaches.  You have nausea, vomiting, or drowsiness.  You have swelling around your face.  You have vision problems.  You have a stiff neck.  You have difficulty breathing. MAKE SURE YOU:   Understand these instructions.  Will watch your condition.  Will get help right away if you are not doing well or get worse. Document Released: 06/22/2005 Document Revised: 09/14/2011 Document Reviewed: 07/07/2011 [facility] Patient Information 2014 [place], [place].

## 2016-08-11 NOTE — Progress Notes (Signed)
Patient presents to clinic today c/o 2 weeks of sinus pressure and maxillary sinus pain L > R. Endorses bilateral ear pain and facial pain. Denies fever. Notes significant nasal congestion with fatigue. Rare dry cough. Denies recent travel. Is currently on prophylactic Tamiflu from her PCP.  Past Medical History:  Diagnosis Date  . Thyroid disease     Current Outpatient Prescriptions on File Prior to Visit  Medication Sig Dispense Refill  . Azelaic Acid 15 % cream After skin is thoroughly washed and patted dry, gently but thoroughly massage a thin film of azelaic acid cream into the affected area twice daily, in the morning and evening. 50 g 3  . cholecalciferol (VITAMIN D) 1000 UNITS tablet Take 2,000 Units by mouth daily.    . metroNIDAZOLE (METROGEL) 0.75 % gel Apply 1 application topically 2 (two) times daily. 45 g 1  . norgestimate-ethinyl estradiol (MONONESSA) 0.25-35 MG-MCG tablet Take 1 tablet by mouth daily. 1 Package 11  . oseltamivir (TAMIFLU) 75 MG capsule Take 1 capsule (75 mg total) by mouth daily. 10 capsule 0  . Thyroid (NATURE-THROID) 81.25 MG TABS Take 1 tablet by mouth daily.     No current facility-administered medications on file prior to visit.     No Known Allergies  Family History  Problem Relation Age of Onset  . Thyroid disease Maternal Grandmother     Social History   Social History  . Marital status: Married    Spouse name: N/A  . Number of children: N/A  . Years of education: N/A   Social History Main Topics  . Smoking status: Former Smoker    Years: 25.00    Types: Cigarettes    Quit date: 07/06/2005  . Smokeless tobacco: Never Used  . Alcohol use Not on file  . Drug use: Unknown  . Sexual activity: Not on file   Other Topics Concern  . Not on file   Social History Narrative  . No narrative on file   Review of Systems - See HPI.  All other ROS are negative.  BP 122/82 (BP Location: Left Arm, Patient Position: Sitting)   Pulse 89    Temp 98.6 F (37 C) (Oral)   Resp 14   Ht 5\' 7"  (1.702 m)   Wt 198 lb 9.6 oz (90.1 kg)   SpO2 99%   BMI 31.11 kg/m   Physical Exam  Constitutional: She is oriented to person, place, and time and well-developed, well-nourished, and in no distress.  HENT:  Head: Normocephalic and atraumatic.  Right Ear: Tympanic membrane normal.  Left Ear: A middle ear effusion is present.  Nose: Mucosal edema and rhinorrhea present. Right sinus exhibits maxillary sinus tenderness. Left sinus exhibits maxillary sinus tenderness.  Mouth/Throat: Uvula is midline, oropharynx is clear and moist and mucous membranes are normal.  Eyes: Conjunctivae are normal.  Neck: Neck supple.  Cardiovascular: Normal rate, regular rhythm, normal heart sounds and intact distal pulses.   Pulmonary/Chest: Effort normal and breath sounds normal. No respiratory distress. She has no wheezes. She has no rales. She exhibits no tenderness.  Lymphadenopathy:    She has no cervical adenopathy.  Neurological: She is alert and oriented to person, place, and time.  Skin: Skin is warm and dry. No rash noted.  Psychiatric: Affect normal.  Vitals reviewed.  Assessment/Plan: 1. Acute bacterial sinusitis Rx Augmentin.  Increase fluids.  Rest.  Saline nasal spray.  Probiotic.  Mucinex as directed.  Humidifier in bedroom. Start antihistamine.  Call or return to clinic if symptoms are not improving.  - amoxicillin-clavulanate (AUGMENTIN) 875-125 MG tablet; Take 1 tablet by mouth 2 (two) times daily.  Dispense: 14 tablet; Refill: 0   Stephanie Lynch, [place]

## 2016-08-17 ENCOUNTER — Encounter: Payer: Self-pay | Admitting: Family Medicine

## 2016-09-23 ENCOUNTER — Ambulatory Visit (INDEPENDENT_AMBULATORY_CARE_PROVIDER_SITE_OTHER): Payer: BLUE CROSS/BLUE SHIELD | Admitting: Podiatry

## 2016-09-23 ENCOUNTER — Encounter: Payer: Self-pay | Admitting: Podiatry

## 2016-09-23 ENCOUNTER — Ambulatory Visit: Payer: Self-pay | Admitting: Podiatry

## 2016-09-23 VITALS — BP 160/86 | HR 79 | Ht 67.0 in | Wt 195.0 lb

## 2016-09-23 DIAGNOSIS — M722 Plantar fascial fibromatosis: Secondary | ICD-10-CM | POA: Diagnosis not present

## 2016-09-23 DIAGNOSIS — M21969 Unspecified acquired deformity of unspecified lower leg: Secondary | ICD-10-CM

## 2016-09-23 DIAGNOSIS — M216X9 Other acquired deformities of unspecified foot: Secondary | ICD-10-CM | POA: Diagnosis not present

## 2016-09-23 NOTE — Progress Notes (Signed)
    SUBJECTIVE: 46 y.o. year old female presents complaining of heel pain. Plantar fasciitis R>L for over a year. Been to Sport medicine and was treated Cortisone injection x 2. Injection helped for a week. Been to Orthopedic Dr. Clovis RileyVoytec and was sent to physical therapy and was treated with Iontophoresis patch. Helped for a couple of months. Last few days have been very bad. She is scheduled to go along with children's field trip and expected to walk long distance. Been using Dr. Margart SicklesScholl's OTC inserts, Comfort steps, and changing shoes. At this time running out of options.  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   REVIEW OF SYSTEMS: A comprehensive review of systems was negative except for: Hypothyroidism under medical management.   OBJECTIVE: DERMATOLOGIC EXAMINATION: No abnormal findings.  VASCULAR EXAMINATION OF LOWER LIMBS: All pedal pulses are palpable with normal pulsation.  Capillary Filling times within 3 seconds in all digits.  No edema or erythema noted.Temperature gradient from tibial crest to dorsum of foot is within normal bilateral.  NEUROLOGIC EXAMINATION OF THE LOWER LIMBS: All epicritic and tactile sensations grossly intact.  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 Some degree of first ray elevation bilateral.  ASSESSMENT: Bilateral plantar fasciitis. Compensatory ankle joint and STJ hyperpronation. Hypermobile first ray bilateral.  PLAN: Reviewed findings and available treatment options. Right foot placed in Ankle brace. Will do custom orthotics next.

## 2016-09-23 NOTE — Patient Instructions (Signed)
Seen for plantar fasciitis both feet. Noted of excess compensatory pronation at ankle and subtalar joint. Ankle brace placed on right foot. May benefit from custom orthotics.  Will call with insurance info.

## 2016-09-24 ENCOUNTER — Ambulatory Visit: Payer: Self-pay | Admitting: Podiatry

## 2016-09-24 ENCOUNTER — Encounter: Payer: Self-pay | Admitting: Podiatry

## 2016-09-28 ENCOUNTER — Ambulatory Visit: Payer: Self-pay | Admitting: Podiatry

## 2016-10-05 ENCOUNTER — Encounter: Payer: Self-pay | Admitting: Physician Assistant

## 2016-10-05 ENCOUNTER — Ambulatory Visit (INDEPENDENT_AMBULATORY_CARE_PROVIDER_SITE_OTHER): Payer: BLUE CROSS/BLUE SHIELD | Admitting: Physician Assistant

## 2016-10-05 DIAGNOSIS — B9689 Other specified bacterial agents as the cause of diseases classified elsewhere: Secondary | ICD-10-CM

## 2016-10-05 DIAGNOSIS — J019 Acute sinusitis, unspecified: Secondary | ICD-10-CM | POA: Diagnosis not present

## 2016-10-05 MED ORDER — AMOXICILLIN-POT CLAVULANATE 875-125 MG PO TABS: 1.0000 | ORAL_TABLET | Freq: Two times a day (BID) | ORAL | 0 refills | Status: DC

## 2016-10-05 MED ORDER — METHYLPREDNISOLONE ACETATE 80 MG/ML IJ SUSP
80.0000 mg | Freq: Once | INTRAMUSCULAR | Status: AC
  Administered 2016-10-05: 40 mg via INTRAMUSCULAR

## 2016-10-05 MED ORDER — AZELASTINE HCL 0.1 % NA SOLN: 2.0000 | Freq: Two times a day (BID) | NASAL | 12 refills | Status: DC

## 2016-10-05 NOTE — Progress Notes (Signed)
Pre visit review using our clinic review tool, if applicable. No additional management support is needed unless otherwise documented below in the visit note. 

## 2016-10-05 NOTE — Addendum Note (Signed)
Addended by: Con Memos on: 10/05/2016 02:15 PM   Modules accepted: Orders

## 2016-10-05 NOTE — Progress Notes (Signed)
   Patient presents to clinic today c/o chronic nasal congestion secondary to allergies but a flare of symptoms over the past 1.5 weeks. Noting now sinus pressure and sinus pain with sore throat. Now with L ear pain tooth pain bilaterally worse on L. Is taking OTC medications with little improvement in symptoms. Has been on Flonase before but could not tolerate well.  Past Medical History:  Diagnosis Date  . Thyroid disease     Current Outpatient Prescriptions on File Prior to Visit  Medication Sig Dispense Refill  . cholecalciferol (VITAMIN D) 1000 UNITS tablet Take 2,000 Units by mouth daily.    . metroNIDAZOLE (METROGEL) 0.75 % gel Apply 1 application topically 2 (two) times daily. 45 g 1  . norgestimate-ethinyl estradiol (MONONESSA) 0.25-35 MG-MCG tablet Take 1 tablet by mouth daily. 1 Package 11  . Thyroid (NATURE-THROID) 81.25 MG TABS Take 1 tablet by mouth daily.     No current facility-administered medications on file prior to visit.     Allergies  Allergen Reactions  . Doxycycline Other (See Comments)    Family History  Problem Relation Age of Onset  . Thyroid disease Maternal Grandmother     Social History   Social History  . Marital status: Married    Spouse name: N/A  . Number of children: N/A  . Years of education: N/A   Social History Main Topics  . Smoking status: Former Smoker    Years: 25.00    Types: Cigarettes    Quit date: 07/06/2005  . Smokeless tobacco: Never Used  . Alcohol use None  . Drug use: Unknown  . Sexual activity: Not Asked   Other Topics Concern  . None   Social History Narrative  . None   Review of Systems - See HPI.  All other ROS are negative.  BP 118/72   Temp 99.2 F (37.3 C) (Oral)   Resp 14   Ht  (1.702 m)   Wt 197 lb (89.4 kg)   BMI 30.85 kg/m   Physical Exam  Constitutional: She is oriented to person, place, and time and well-developed, well-nourished, and in no distress.  HENT:  Head: Normocephalic and  atraumatic.  Right Ear: Tympanic membrane normal.  Left Ear: Tympanic membrane is retracted.  Nose: Mucosal edema and rhinorrhea present. Left sinus exhibits maxillary sinus tenderness and frontal sinus tenderness.  Mouth/Throat: Uvula is midline, oropharynx is clear and moist and mucous membranes are normal.  Eyes: Conjunctivae are normal.  Neck: Neck supple.  Cardiovascular: Normal rate, regular rhythm, normal heart sounds and intact distal pulses.   Pulmonary/Chest: Effort normal and breath sounds normal. No respiratory distress. She has no wheezes. She has no rales. She exhibits no tenderness.  Neurological: She is alert and oriented to person, place, and time.  Skin: Skin is warm and dry. No rash noted.  Psychiatric: Affect normal.  Vitals reviewed.  Assessment/Plan: 1. Acute bacterial sinusitis Start Augmentin. Astelin nasal sent in. IM Depomedrol given to help with inflammation. Supportive measures and OTC medications reviewed. ENT referral placed due to frequent infections.   - amoxicillin-clavulanate (AUGMENTIN) 875-125 MG tablet; Take 1 tablet by mouth 2 (two) times daily.  Dispense: 20 tablet; Refill: 0 - Ambulatory referral to ENT - azelastine (ASTELIN) 0.1 % nasal spray; Place 2 sprays into both nostrils 2 (two) times daily. Use in each nostril as directed  Dispense: 30 mL; Refill: 12   Marcelline Mates Hernandez, [place]

## 2016-10-05 NOTE — Patient Instructions (Signed)
Please take antibiotic as directed.  Increase fluid intake.  Use Saline nasal spray.  Take a daily multivitamin. Use Astelin as directed.  Place a humidifier in the bedroom.  Please call or return clinic if symptoms are not improving.  Sinusitis Sinusitis is redness, soreness, and swelling (inflammation) of the paranasal sinuses. Paranasal sinuses are air pockets within the bones of your face (beneath the eyes, the middle of the forehead, or above the eyes). In healthy paranasal sinuses, mucus is able to drain out, and air is able to circulate through them by way of your nose. However, when your paranasal sinuses are inflamed, mucus and air can become trapped. This can allow bacteria and other germs to grow and cause infection. Sinusitis can develop quickly and last only a short time (acute) or continue over a long period (chronic). Sinusitis that lasts for more than 12 weeks is considered chronic.  CAUSES  Causes of sinusitis include:  Allergies.  Structural abnormalities, such as displacement of the cartilage that separates your nostrils (deviated septum), which can decrease the air flow through your nose and sinuses and affect sinus drainage.  Functional abnormalities, such as when the small hairs (cilia) that line your sinuses and help remove mucus do not work properly or are not present. SYMPTOMS  Symptoms of acute and chronic sinusitis are the same. The primary symptoms are pain and pressure around the affected sinuses. Other symptoms include:  Upper toothache.  Earache.  Headache.  Bad breath.  Decreased sense of smell and taste.  A cough, which worsens when you are lying flat.  Fatigue.  Fever.  Thick drainage from your nose, which often is green and may contain pus (purulent).  Swelling and warmth over the affected sinuses. DIAGNOSIS  Your caregiver will perform a physical exam. During the exam, your caregiver may:  Look in your nose for signs of abnormal growths in  your nostrils (nasal polyps).  Tap over the affected sinus to check for signs of infection.  View the inside of your sinuses (endoscopy) with a special imaging device with a light attached (endoscope), which is inserted into your sinuses. If your caregiver suspects that you have chronic sinusitis, one or more of the following tests may be recommended:  Allergy tests.  Nasal culture A sample of mucus is taken from your nose and sent to a lab and screened for bacteria.  Nasal cytology A sample of mucus is taken from your nose and examined by your caregiver to determine if your sinusitis is related to an allergy. TREATMENT  Most cases of acute sinusitis are related to a viral infection and will resolve on their own within 10 days. Sometimes medicines are prescribed to help relieve symptoms (pain medicine, decongestants, nasal steroid sprays, or saline sprays).  However, for sinusitis related to a bacterial infection, your caregiver will prescribe antibiotic medicines. These are medicines that will help kill the bacteria causing the infection.  Rarely, sinusitis is caused by a fungal infection. In theses cases, your caregiver will prescribe antifungal medicine. For some cases of chronic sinusitis, surgery is needed. Generally, these are cases in which sinusitis recurs more than 3 times per year, despite other treatments. HOME CARE INSTRUCTIONS   Drink plenty of water. Water helps thin the mucus so your sinuses can drain more easily.  Use a humidifier.  Inhale steam 3 to 4 times a day (for example, sit in the bathroom with the shower running).  Apply a warm, moist washcloth to your face 3 to  4 times a day, or as directed by your caregiver.  Use saline nasal sprays to help moisten and clean your sinuses.  Take over-the-counter or prescription medicines for pain, discomfort, or fever only as directed by your caregiver. SEEK IMMEDIATE MEDICAL CARE IF:  You have increasing pain or severe  headaches.  You have nausea, vomiting, or drowsiness.  You have swelling around your face.  You have vision problems.  You have a stiff neck.  You have difficulty breathing. MAKE SURE YOU:   Understand these instructions.  Will watch your condition.  Will get help right away if you are not doing well or get worse. Document Released: 06/22/2005 Document Revised: 09/14/2011 Document Reviewed: 07/07/2011 [facility] Patient Information 2014 [place], [place].

## 2016-10-07 ENCOUNTER — Encounter: Payer: Self-pay | Admitting: Physician Assistant

## 2016-10-30 ENCOUNTER — Encounter: Payer: Self-pay | Admitting: Family Medicine

## 2016-10-30 MED ORDER — HYDROCORTISONE 2.5 % RE CREA: 1.0000 "application " | TOPICAL_CREAM | Freq: Two times a day (BID) | RECTAL | 1 refills | Status: DC

## 2016-11-28 ENCOUNTER — Encounter (HOSPITAL_COMMUNITY): Payer: Self-pay | Admitting: Emergency Medicine

## 2016-11-28 ENCOUNTER — Ambulatory Visit (HOSPITAL_COMMUNITY)
Admission: EM | Admit: 2016-11-28 | Discharge: 2016-11-28 | Disposition: A | Discharge status: Home / Self Care | Payer: BLUE CROSS/BLUE SHIELD | Attending: Family Medicine | Admitting: Family Medicine

## 2016-11-28 DIAGNOSIS — R58 Hemorrhage, not elsewhere classified: Secondary | ICD-10-CM | POA: Diagnosis not present

## 2016-11-28 NOTE — ED Triage Notes (Signed)
Pt reports she noticed abnormal bruising on bilateral lower extremities associated w/fatigue and HAs  Denies inj/trauma  Reports she has vit D deficiency  And hx of Hashimoto   A&O x4... NAD... Ambulatory

## 2016-11-28 NOTE — Discharge Instructions (Signed)
At this time it seems reasonable to wait until to see to see your primary care provider. His long she do not have any bleeding in other areas, should be okay. You may likely will need blood test to check your clotting and bleeding factors. Part of your fatigue is also likely due to your current busy lifestyle. If you develop any worsening or bright red bleeding, severe fatigue or other symptoms sick medical attention promptly. Also restart your vitamins as directed.

## 2016-11-28 NOTE — ED Provider Notes (Signed)
CSN: [id number]     Arrival date & time 11/28/16  1201 History   First MD Initiated Contact with Patient 11/28/16 1214     Chief Complaint  Patient presents with  . Bleeding/Bruising   (Consider location/radiation/quality/duration/timing/severity/associated sxs/prior Treatment) 46 year old female presents to the urgent care with concern of bruising with minor trauma. She states although ball of her life she bruises easily when she does bump yourself against something it has been more prominent in the past week or 2. He states that she does recall bumping her legs or knees on some objects but she also can scratch herself and develop a bruise as well. She states this is more than usual this week. Other complaints include feeling "tired this week and mild headache. She describes a very hectic lifestyle being very busy and not sleeping well. She states she also supposed to be on vitamin B12 and the and she has not been taking those regularly. Denies nausea, vomiting or other GI symptoms. Denies seeing bleeding from the colon or other orifices. No bleeding from the skin. No wounds.       Past Medical History:  Diagnosis Date  . Thyroid disease    History reviewed. No pertinent surgical history. Family History  Problem Relation Age of Onset  . Thyroid disease Maternal Grandmother    Social History  Substance Use Topics  . Smoking status: Former Smoker    Years: 25.00    Types: Cigarettes    Quit date: 07/06/2005  . Smokeless tobacco: Never Used  . Alcohol use Not on file   OB History    No data available     Review of Systems  Constitutional: Negative.        Some weakness/fatigue.  HENT: Negative.   Respiratory: Negative.   Cardiovascular: Negative.   Gastrointestinal: Negative.   Genitourinary: Negative.   Skin: Negative.   Neurological: Negative.   All other systems reviewed and are negative.   Allergies  Doxycycline  Home Medications   Prior to Admission medications    Medication Sig Start Date End Date Taking? Authorizing Provider  cholecalciferol (VITAMIN D) 1000 UNITS tablet Take 2,000 Units by mouth daily.   Yes [provider]  Cyanocobalamin (VITAMIN B-12) 2500 MCG SUBL Place 1 tablet under the tongue daily.   Yes [provider]  levothyroxine (SYNTHROID) 25 MCG tablet Take 25 mcg by mouth daily before breakfast.   Yes [provider]  norgestimate-ethinyl estradiol (MONONESSA) 0.25-35 MG-MCG tablet Take 1 tablet by mouth daily. 01/29/16  Yes Sheliah Hatch, MD  Thyroid (NATURE-THROID) 81.25 MG TABS Take 1 tablet by mouth daily.   Yes [provider]  amoxicillin-clavulanate (AUGMENTIN) 875-125 MG tablet Take 1 tablet by mouth 2 (two) times daily. 10/05/16   Waldon Merl, PA-C  metroNIDAZOLE (METROGEL) 0.75 % gel Apply 1 application topically 2 (two) times daily. 06/11/16   Sheliah Hatch, MD   Meds Ordered and Administered this Visit  Medications - No data to display  BP (!) 154/84 (BP Location: Left Arm)   Pulse 88   Temp 98.7 F (37.1 C) (Oral)   Resp 20   LMP 11/07/2016   SpO2 100%  No data found.   Physical Exam  Constitutional: She is oriented to person, place, and time. She appears well-developed and well-nourished. No distress.  Eyes: EOM are normal.  Neck: Normal range of motion. Neck supple.  Cardiovascular: Normal rate, regular rhythm, normal heart sounds and intact distal pulses.  No murmur heard. Pulmonary/Chest: Effort normal and breath sounds normal. No respiratory distress. She has no wheezes.  Abdominal: Soft. Bowel sounds are normal. There is no tenderness. There is no guarding.  Musculoskeletal: She exhibits no edema.  Lymphadenopathy:    She has no cervical adenopathy.  Neurological: She is alert and oriented to person, place, and time. She exhibits normal muscle tone.  Skin: Skin is warm and dry.  Various areas of small primarily round for a regular ecchymosis to the  both left and right lower extremities. Mostly at the knee level or down. No swelling of the extremities.  Psychiatric: She has a normal mood and affect.  Nursing note and vitals reviewed.   Urgent Care Course     Procedures (including critical care time)  Labs Review Labs Reviewed - No data to display  Imaging Review No results found.   Visual Acuity Review  Right Eye Distance:   Left Eye Distance:   Bilateral Distance:    Right Eye Near:   Left Eye Near:    Bilateral Near:         MDM   1. Ecchymosis   Reassurance At this time it seems reasonable to wait until to see to see your primary care provider. His long she do not have any bleeding in other areas, should be okay. You may likely will need blood test to check your clotting and bleeding factors. Part of your fatigue is also likely due to your current busy lifestyle. If you develop any worsening or bright red bleeding, severe fatigue or other symptoms sick medical attention promptly. Also restart your vitamins as directed.    Hayden RasmussenMabe, Yesenia Fontenette, NP 11/28/16 1243

## 2017-01-08 ENCOUNTER — Other Ambulatory Visit: Payer: Self-pay | Admitting: Family Medicine

## 2017-02-01 ENCOUNTER — Other Ambulatory Visit: Payer: Self-pay | Admitting: Family Medicine

## 2017-07-03 ENCOUNTER — Telehealth: Payer: BLUE CROSS/BLUE SHIELD | Admitting: Family

## 2017-07-03 DIAGNOSIS — B9689 Other specified bacterial agents as the cause of diseases classified elsewhere: Secondary | ICD-10-CM

## 2017-07-03 DIAGNOSIS — J019 Acute sinusitis, unspecified: Secondary | ICD-10-CM

## 2017-07-03 MED ORDER — AMOXICILLIN-POT CLAVULANATE 875-125 MG PO TABS: 1.0000 | ORAL_TABLET | Freq: Two times a day (BID) | ORAL | 0 refills | Status: DC

## 2017-07-03 NOTE — Progress Notes (Signed)

## 2017-10-21 ENCOUNTER — Telehealth: Payer: BLUE CROSS/BLUE SHIELD | Admitting: Physician Assistant

## 2017-10-21 DIAGNOSIS — B9689 Other specified bacterial agents as the cause of diseases classified elsewhere: Secondary | ICD-10-CM

## 2017-10-21 DIAGNOSIS — J019 Acute sinusitis, unspecified: Secondary | ICD-10-CM

## 2017-10-21 MED ORDER — AMOXICILLIN-POT CLAVULANATE 875-125 MG PO TABS: 1.0000 | ORAL_TABLET | Freq: Two times a day (BID) | ORAL | 0 refills | Status: DC

## 2017-10-21 NOTE — Progress Notes (Signed)

## 2017-11-18 ENCOUNTER — Encounter: Payer: Self-pay | Admitting: Emergency Medicine

## 2017-11-23 ENCOUNTER — Encounter: Payer: Self-pay | Admitting: General Practice

## 2018-06-21 ENCOUNTER — Ambulatory Visit: Payer: Self-pay

## 2018-06-21 NOTE — Telephone Encounter (Signed)
  Pt daughter is positive for flu B Pt is requesting tamiflu for herself.  She has sinus pressure but no fever. Reason for Disposition . [1] Influenza EXPOSURE (Close Contact) within last 48 hours (2 days) AND [2] exposed person is HIGH RISK (e.g., age > 64 years, pregnant, HIV+, chronic medical condition)  Answer Assessment - Initial Assessment Questions 1. TYPE of EXPOSURE: "How were you exposed?" (e.g., close contact, not a close contact)     daughter 2. DATE of EXPOSURE: "When did the exposure occur?" (e.g., hour, days, weeks)     Today  3. PREGNANCY: "Is there any chance you are pregnant?" "When was your last menstrual period?"     No  4. HIGH RISK for COMPLICATIONS: "Do you have any heart or lung problems? Do you have a weakened immune system?" (e.g., CHF, COPD, asthma, HIV positive, chemotherapy, renal failure, diabetes mellitus, sickle cell anemia)     no 5. SYMPTOMS: "Do you have any symptoms?" (e.g., cough, fever, sore throat, difficulty breathing).     Sinus pressure no temperature  Protocols used: INFLUENZA EXPOSURE-A-AH

## 2018-06-21 NOTE — Telephone Encounter (Signed)
Please advise 

## 2018-06-22 MED ORDER — OSELTAMIVIR PHOSPHATE 75 MG PO CAPS: 75.0000 mg | ORAL_CAPSULE | Freq: Every day | ORAL | 0 refills | Status: DC

## 2018-06-22 NOTE — Addendum Note (Signed)
Addended by: Geannie RisenBRODMERKEL, Tylar Merendino L on: 06/22/2018 08:15 AM   Modules accepted: Orders

## 2018-06-22 NOTE — Telephone Encounter (Signed)
Medication filled to pharmacy as requested.   

## 2018-06-22 NOTE — Telephone Encounter (Signed)
Ok for Tamiflu 75mg  once daily x10 days, #10

## 2018-08-09 DIAGNOSIS — E538 Deficiency of other specified B group vitamins: Secondary | ICD-10-CM | POA: Diagnosis not present

## 2018-08-09 DIAGNOSIS — R7982 Elevated C-reactive protein (CRP): Secondary | ICD-10-CM | POA: Diagnosis not present

## 2018-08-09 DIAGNOSIS — E039 Hypothyroidism, unspecified: Secondary | ICD-10-CM | POA: Diagnosis not present

## 2018-08-09 DIAGNOSIS — E063 Autoimmune thyroiditis: Secondary | ICD-10-CM | POA: Diagnosis not present

## 2018-10-03 ENCOUNTER — Encounter: Payer: Self-pay | Admitting: Family Medicine

## 2018-10-05 DIAGNOSIS — E063 Autoimmune thyroiditis: Secondary | ICD-10-CM | POA: Diagnosis not present

## 2018-10-05 DIAGNOSIS — E039 Hypothyroidism, unspecified: Secondary | ICD-10-CM | POA: Diagnosis not present

## 2018-10-05 DIAGNOSIS — R7982 Elevated C-reactive protein (CRP): Secondary | ICD-10-CM | POA: Diagnosis not present

## 2018-10-05 DIAGNOSIS — E559 Vitamin D deficiency, unspecified: Secondary | ICD-10-CM | POA: Diagnosis not present

## 2018-10-05 DIAGNOSIS — E538 Deficiency of other specified B group vitamins: Secondary | ICD-10-CM | POA: Diagnosis not present

## 2018-10-20 DIAGNOSIS — E039 Hypothyroidism, unspecified: Secondary | ICD-10-CM | POA: Diagnosis not present

## 2018-10-20 DIAGNOSIS — E063 Autoimmune thyroiditis: Secondary | ICD-10-CM | POA: Diagnosis not present

## 2018-10-20 DIAGNOSIS — N959 Unspecified menopausal and perimenopausal disorder: Secondary | ICD-10-CM | POA: Diagnosis not present

## 2018-10-20 DIAGNOSIS — E538 Deficiency of other specified B group vitamins: Secondary | ICD-10-CM | POA: Diagnosis not present

## 2018-12-23 DIAGNOSIS — E559 Vitamin D deficiency, unspecified: Secondary | ICD-10-CM | POA: Diagnosis not present

## 2018-12-23 DIAGNOSIS — E063 Autoimmune thyroiditis: Secondary | ICD-10-CM | POA: Diagnosis not present

## 2018-12-23 DIAGNOSIS — E039 Hypothyroidism, unspecified: Secondary | ICD-10-CM | POA: Diagnosis not present

## 2018-12-25 ENCOUNTER — Ambulatory Visit (INDEPENDENT_AMBULATORY_CARE_PROVIDER_SITE_OTHER)
Admission: RE | Admit: 2018-12-25 | Discharge: 2018-12-25 | Disposition: A | Discharge status: Home / Self Care | Payer: BC Managed Care – PPO | Source: Ambulatory Visit

## 2018-12-25 DIAGNOSIS — J320 Chronic maxillary sinusitis: Secondary | ICD-10-CM | POA: Diagnosis not present

## 2018-12-25 DIAGNOSIS — B9689 Other specified bacterial agents as the cause of diseases classified elsewhere: Secondary | ICD-10-CM | POA: Diagnosis not present

## 2018-12-25 DIAGNOSIS — J019 Acute sinusitis, unspecified: Secondary | ICD-10-CM

## 2018-12-25 MED ORDER — AMOXICILLIN-POT CLAVULANATE 875-125 MG PO TABS: 1.0000 | ORAL_TABLET | Freq: Two times a day (BID) | ORAL | 0 refills | Status: DC

## 2018-12-25 NOTE — Discharge Instructions (Signed)
Take antibiotic as instructed. Continue flonase and allergy medications. Drink water throughout day to stay hydrated and keep secretions thin.

## 2018-12-25 NOTE — ED Provider Notes (Signed)
Virtual Visit via Video Note:  Stephanie Lynch  initiated request for Telemedicine visit with [facility] team. I connected with Stephanie Lynch  on 12/25/2018 at 3:45 PM  for a synchronized telemedicine visit using a video enabled HIPPA compliant telemedicine application. I verified that I am speaking with Stephanie Lynch  using two identifiers. Stephanie Hall-Potvin, PA-C  was physically located in a [facility] site and Stephanie Lynch was located at a different location.   The limitations of evaluation and management by telemedicine as well as the availability of in-person appointments were discussed. Patient was informed that she  may incur a bill ( including co-pay) for this virtual visit encounter. Stephanie Lynch  expressed understanding and gave verbal consent to proceed with virtual visit.     History of Present Illness:Stephanie Lynch  is a 48 y.o. female presents with acute concern of sinus infection.  Patient reports history of chronic sinusitis and deviated septum.  Patient has been evaluated by ENT in the past: Tends to get antibiotics once a year due to this issue.  Patient states that she has had sinus congestion and pressure for the last month, worsening over the past 2 weeks.  Patient has been compliant with her daytime allergy medication, Flonase.  Patient denies fever, though is endorsing severe pressure, congestion, frontal headaches, post nasal drip, left periorbital edema.  Patient denies ear pain, fullness, popping, throat pain.  Past Medical History:  Diagnosis Date  . Thyroid disease     Allergies  Allergen Reactions  . Doxycycline Other (See Comments)        Observations/Objective: 48 year old female sitting in no acute distress.  Mild edema of left zygomatic arch and lower periorbital space without ecchymosis or erythema.  Assessment and Plan: 48 year old female with history of chronic sinusitis presenting for worsening symptoms,  concern for infection.  Given duration, current pharmacologic intervention, symptomatology will start antibiotics.  Follow Up Instructions: Patient instructed to follow-up with her PCP, ENT if needed.   I discussed the assessment and treatment plan with the patient. The patient was provided an opportunity to ask questions and all were answered. The patient agreed with the plan and demonstrated an understanding of the instructions.   The patient was advised to call back or seek an in-person evaluation if the symptoms worsen or if the condition fails to improve as anticipated.  I provided 15 minutes of non-face-to-face time during this encounter.    Stephanie Hall-Potvin, PA-C  12/25/2018 3:45 PM        Lynch, Stephanie, [place] 12/25/18 1545

## 2019-01-05 DIAGNOSIS — E039 Hypothyroidism, unspecified: Secondary | ICD-10-CM | POA: Diagnosis not present

## 2019-01-05 DIAGNOSIS — E538 Deficiency of other specified B group vitamins: Secondary | ICD-10-CM | POA: Diagnosis not present

## 2019-01-05 DIAGNOSIS — N959 Unspecified menopausal and perimenopausal disorder: Secondary | ICD-10-CM | POA: Diagnosis not present

## 2019-01-05 DIAGNOSIS — E669 Obesity, unspecified: Secondary | ICD-10-CM | POA: Diagnosis not present

## 2019-01-09 DIAGNOSIS — R040 Epistaxis: Secondary | ICD-10-CM | POA: Diagnosis not present

## 2019-01-23 DIAGNOSIS — J310 Chronic rhinitis: Secondary | ICD-10-CM | POA: Diagnosis not present

## 2019-01-30 DIAGNOSIS — H00015 Hordeolum externum left lower eyelid: Secondary | ICD-10-CM | POA: Diagnosis not present

## 2019-03-27 DIAGNOSIS — S46911A Strain of unspecified muscle, fascia and tendon at shoulder and upper arm level, right arm, initial encounter: Secondary | ICD-10-CM | POA: Diagnosis not present

## 2019-04-06 DIAGNOSIS — S46911D Strain of unspecified muscle, fascia and tendon at shoulder and upper arm level, right arm, subsequent encounter: Secondary | ICD-10-CM | POA: Diagnosis not present

## 2019-05-15 DIAGNOSIS — E039 Hypothyroidism, unspecified: Secondary | ICD-10-CM | POA: Diagnosis not present

## 2019-05-15 DIAGNOSIS — N951 Menopausal and female climacteric states: Secondary | ICD-10-CM | POA: Diagnosis not present

## 2019-05-26 DIAGNOSIS — E042 Nontoxic multinodular goiter: Secondary | ICD-10-CM | POA: Diagnosis not present

## 2019-05-26 DIAGNOSIS — N951 Menopausal and female climacteric states: Secondary | ICD-10-CM | POA: Diagnosis not present

## 2019-05-26 DIAGNOSIS — E063 Autoimmune thyroiditis: Secondary | ICD-10-CM | POA: Diagnosis not present

## 2019-05-26 DIAGNOSIS — E039 Hypothyroidism, unspecified: Secondary | ICD-10-CM | POA: Diagnosis not present

## 2019-07-28 DIAGNOSIS — E038 Other specified hypothyroidism: Secondary | ICD-10-CM | POA: Diagnosis not present

## 2019-07-28 DIAGNOSIS — E063 Autoimmune thyroiditis: Secondary | ICD-10-CM | POA: Diagnosis not present

## 2019-08-30 DIAGNOSIS — Z6830 Body mass index (BMI) 30.0-30.9, adult: Secondary | ICD-10-CM | POA: Diagnosis not present

## 2019-08-30 DIAGNOSIS — N951 Menopausal and female climacteric states: Secondary | ICD-10-CM | POA: Diagnosis not present

## 2019-08-30 DIAGNOSIS — Z01419 Encounter for gynecological examination (general) (routine) without abnormal findings: Secondary | ICD-10-CM | POA: Diagnosis not present

## 2019-08-30 DIAGNOSIS — Z124 Encounter for screening for malignant neoplasm of cervix: Secondary | ICD-10-CM | POA: Diagnosis not present

## 2019-08-30 DIAGNOSIS — Z1231 Encounter for screening mammogram for malignant neoplasm of breast: Secondary | ICD-10-CM | POA: Diagnosis not present

## 2019-09-13 DIAGNOSIS — E063 Autoimmune thyroiditis: Secondary | ICD-10-CM | POA: Diagnosis not present

## 2019-09-13 DIAGNOSIS — E038 Other specified hypothyroidism: Secondary | ICD-10-CM | POA: Diagnosis not present

## 2019-09-29 ENCOUNTER — Ambulatory Visit: Payer: BC Managed Care – PPO

## 2019-11-27 ENCOUNTER — Ambulatory Visit: Payer: BC Managed Care – PPO

## 2019-11-29 ENCOUNTER — Ambulatory Visit: Payer: BC Managed Care – PPO | Attending: Internal Medicine

## 2019-11-29 DIAGNOSIS — Z23 Encounter for immunization: Secondary | ICD-10-CM

## 2019-11-29 NOTE — Progress Notes (Signed)
   Covid-19 Vaccination Clinic  Name:  CHEMEKA FILICE    MRN: [id number] DOB: April 30, 1971  11/29/2019  Ms. Goar was observed post Covid-19 immunization for 15 minutes without incident. She was provided with Vaccine Information Sheet and instruction to access the V-Safe system.   Ms. Peery was instructed to call 911 with any severe reactions post vaccine: Marland Kitchen Difficulty breathing  . Swelling of face and throat  . A fast heartbeat  . A bad rash all over body  . Dizziness and weakness   Immunizations Administered    Name Date Dose VIS Date Route   Pfizer COVID-19 Vaccine 11/29/2019  2:06 PM 0.3 mL 08/30/2018 Intramuscular   Manufacturer: [employer], [employer]   Lot: [id number]   NDC: [address]-6

## 2019-12-08 DIAGNOSIS — E063 Autoimmune thyroiditis: Secondary | ICD-10-CM | POA: Diagnosis not present

## 2019-12-08 DIAGNOSIS — E038 Other specified hypothyroidism: Secondary | ICD-10-CM | POA: Diagnosis not present

## 2019-12-28 ENCOUNTER — Ambulatory Visit: Payer: BC Managed Care – PPO | Attending: Internal Medicine

## 2019-12-28 DIAGNOSIS — Z23 Encounter for immunization: Secondary | ICD-10-CM

## 2019-12-28 NOTE — Progress Notes (Signed)
   Covid-19 Vaccination Clinic  Name:  CHANY WOOLWORTH    MRN: [id number] DOB: 05/19/1971  12/28/2019  Ms. Tenpas was observed post Covid-19 immunization for 15 minutes without incident. She was provided with Vaccine Information Sheet and instruction to access the V-Safe system.   Ms. Fellows was instructed to call 911 with any severe reactions post vaccine: Marland Kitchen Difficulty breathing  . Swelling of face and throat  . A fast heartbeat  . A bad rash all over body  . Dizziness and weakness   Immunizations Administered    Name Date Dose VIS Date Route   Pfizer COVID-19 Vaccine 12/28/2019  3:36 PM 0.3 mL 08/30/2018 Intramuscular   Manufacturer: [employer], [employer]   Lot: [id number]   NDC: [address]-1

## 2020-01-23 DIAGNOSIS — D1801 Hemangioma of skin and subcutaneous tissue: Secondary | ICD-10-CM | POA: Diagnosis not present

## 2020-01-23 DIAGNOSIS — D485 Neoplasm of uncertain behavior of skin: Secondary | ICD-10-CM | POA: Diagnosis not present

## 2020-01-23 DIAGNOSIS — D2362 Other benign neoplasm of skin of left upper limb, including shoulder: Secondary | ICD-10-CM | POA: Diagnosis not present

## 2020-02-20 DIAGNOSIS — E669 Obesity, unspecified: Secondary | ICD-10-CM | POA: Diagnosis not present

## 2020-02-20 DIAGNOSIS — E039 Hypothyroidism, unspecified: Secondary | ICD-10-CM | POA: Diagnosis not present

## 2020-02-20 DIAGNOSIS — Z780 Asymptomatic menopausal state: Secondary | ICD-10-CM | POA: Diagnosis not present

## 2020-02-20 DIAGNOSIS — R946 Abnormal results of thyroid function studies: Secondary | ICD-10-CM | POA: Diagnosis not present

## 2020-03-18 DIAGNOSIS — D2362 Other benign neoplasm of skin of left upper limb, including shoulder: Secondary | ICD-10-CM | POA: Diagnosis not present

## 2020-03-28 DIAGNOSIS — D2362 Other benign neoplasm of skin of left upper limb, including shoulder: Secondary | ICD-10-CM | POA: Diagnosis not present

## 2020-04-19 DIAGNOSIS — Z0000 Encounter for general adult medical examination without abnormal findings: Secondary | ICD-10-CM | POA: Diagnosis not present

## 2020-04-19 DIAGNOSIS — E039 Hypothyroidism, unspecified: Secondary | ICD-10-CM | POA: Diagnosis not present

## 2020-04-19 DIAGNOSIS — Z13220 Encounter for screening for lipoid disorders: Secondary | ICD-10-CM | POA: Diagnosis not present

## 2020-08-19 ENCOUNTER — Encounter: Payer: Self-pay | Admitting: Endocrinology

## 2020-09-17 DIAGNOSIS — E039 Hypothyroidism, unspecified: Secondary | ICD-10-CM | POA: Diagnosis not present

## 2020-09-25 DIAGNOSIS — L718 Other rosacea: Secondary | ICD-10-CM | POA: Diagnosis not present

## 2020-10-08 ENCOUNTER — Other Ambulatory Visit (HOSPITAL_BASED_OUTPATIENT_CLINIC_OR_DEPARTMENT_OTHER): Payer: Self-pay | Admitting: Internal Medicine

## 2020-10-08 DIAGNOSIS — Z780 Asymptomatic menopausal state: Secondary | ICD-10-CM

## 2020-10-11 DIAGNOSIS — E538 Deficiency of other specified B group vitamins: Secondary | ICD-10-CM | POA: Diagnosis not present

## 2020-10-11 DIAGNOSIS — E039 Hypothyroidism, unspecified: Secondary | ICD-10-CM | POA: Diagnosis not present

## 2020-10-11 DIAGNOSIS — N951 Menopausal and female climacteric states: Secondary | ICD-10-CM | POA: Diagnosis not present

## 2020-10-11 DIAGNOSIS — E559 Vitamin D deficiency, unspecified: Secondary | ICD-10-CM | POA: Diagnosis not present

## 2020-10-11 DIAGNOSIS — E063 Autoimmune thyroiditis: Secondary | ICD-10-CM | POA: Diagnosis not present

## 2020-10-11 DIAGNOSIS — Z131 Encounter for screening for diabetes mellitus: Secondary | ICD-10-CM | POA: Diagnosis not present

## 2020-10-28 DIAGNOSIS — Z124 Encounter for screening for malignant neoplasm of cervix: Secondary | ICD-10-CM | POA: Diagnosis not present

## 2020-10-28 DIAGNOSIS — Z01419 Encounter for gynecological examination (general) (routine) without abnormal findings: Secondary | ICD-10-CM | POA: Diagnosis not present

## 2020-10-28 DIAGNOSIS — Z1231 Encounter for screening mammogram for malignant neoplasm of breast: Secondary | ICD-10-CM | POA: Diagnosis not present

## 2020-10-28 DIAGNOSIS — Z6831 Body mass index (BMI) 31.0-31.9, adult: Secondary | ICD-10-CM | POA: Diagnosis not present

## 2020-10-28 DIAGNOSIS — Z113 Encounter for screening for infections with a predominantly sexual mode of transmission: Secondary | ICD-10-CM | POA: Diagnosis not present

## 2020-11-02 LAB — HM PAP SMEAR

## 2020-11-05 DIAGNOSIS — K5904 Chronic idiopathic constipation: Secondary | ICD-10-CM | POA: Diagnosis not present

## 2020-11-05 DIAGNOSIS — Z1211 Encounter for screening for malignant neoplasm of colon: Secondary | ICD-10-CM | POA: Diagnosis not present

## 2020-11-05 DIAGNOSIS — E669 Obesity, unspecified: Secondary | ICD-10-CM | POA: Diagnosis not present

## 2020-11-05 DIAGNOSIS — R140 Abdominal distension (gaseous): Secondary | ICD-10-CM | POA: Diagnosis not present

## 2020-12-06 DIAGNOSIS — R040 Epistaxis: Secondary | ICD-10-CM | POA: Diagnosis not present

## 2020-12-06 DIAGNOSIS — J310 Chronic rhinitis: Secondary | ICD-10-CM | POA: Diagnosis not present

## 2020-12-23 ENCOUNTER — Other Ambulatory Visit (HOSPITAL_BASED_OUTPATIENT_CLINIC_OR_DEPARTMENT_OTHER): Payer: BC Managed Care – PPO

## 2021-01-01 ENCOUNTER — Encounter: Payer: Self-pay | Admitting: *Deleted

## 2021-01-03 DIAGNOSIS — D120 Benign neoplasm of cecum: Secondary | ICD-10-CM | POA: Diagnosis not present

## 2021-01-03 DIAGNOSIS — K635 Polyp of colon: Secondary | ICD-10-CM | POA: Diagnosis not present

## 2021-01-03 DIAGNOSIS — Z1211 Encounter for screening for malignant neoplasm of colon: Secondary | ICD-10-CM | POA: Diagnosis not present

## 2021-01-03 DIAGNOSIS — D124 Benign neoplasm of descending colon: Secondary | ICD-10-CM | POA: Diagnosis not present

## 2021-01-03 DIAGNOSIS — D123 Benign neoplasm of transverse colon: Secondary | ICD-10-CM | POA: Diagnosis not present

## 2021-02-18 ENCOUNTER — Ambulatory Visit: Payer: BC Managed Care – PPO | Admitting: Internal Medicine

## 2021-02-18 ENCOUNTER — Other Ambulatory Visit: Payer: Self-pay

## 2021-02-18 VITALS — BP 122/80 | HR 77 | Ht 67.0 in | Wt 204.6 lb

## 2021-02-18 DIAGNOSIS — E538 Deficiency of other specified B group vitamins: Secondary | ICD-10-CM

## 2021-02-18 DIAGNOSIS — E063 Autoimmune thyroiditis: Secondary | ICD-10-CM

## 2021-02-18 NOTE — Progress Notes (Signed)
Name: Stephanie Lynch  MRN/ DOB: [id number], [birth year]    Age/ Sex: 50 y.o., female    PCP: Sheliah Hatch, MD   Reason for Endocrinology Evaluation: Hypothyroidism      Date of Initial Endocrinology Evaluation: 02/18/2021     HPI: Ms. Stephanie Lynch is a 50 y.o. female with a past medical history of Hypothyroidism. The patient presented for initial endocrinology clinic visit on 02/18/2021 for consultative assistance with her Hypothyroidism.   She has been diagnosed with hypothyroidism since her 20's due to Hashimoto's thyroiditis . Has been on LT-4 replacement since then , over the yrs  has tried been on Levothyroxine , synthroid and desiccated thyroid . She also had cytomel which causes hair thinning   Her main issue is weight gain  Has chronic fatigue and constipation    On bio- identical HRT through Dr. Billy Coast for the past spring 2022 Was seen by integrative health at some point   Maternal grand mother had hypothyroidism and a paternal great aunt with Graves' disease  Maternal grand mother had ovarian cancer   No biotin intake   Currently on levothyroxine 88 mcg daily   She is on vitamin D 7500 iu 3x a week   Consumes calcium through diet    HISTORY:  Past Medical History:  Past Medical History:  Diagnosis Date   Thyroid disease    Past Surgical History: No past surgical history on file.  Social History:  reports that she quit smoking about 15 years ago. Her smoking use included cigarettes. She has never used smokeless tobacco. Family History: family history includes Thyroid disease in her maternal grandmother.   HOME MEDICATIONS: Allergies as of 02/18/2021       Reactions   Doxycycline Other (See Comments)   Upset stomach        Medication List        Accurate as of February 18, 2021  3:39 PM. If you have any questions, ask your nurse or doctor.          STOP taking these medications    amoxicillin-clavulanate 875-125 MG  tablet Commonly known as: AUGMENTIN Stopped by: Scarlette Shorts, MD       TAKE these medications    cholecalciferol 1000 units tablet Commonly known as: VITAMIN D Take 2,000 Units by mouth daily.   levothyroxine 25 MCG tablet Commonly known as: SYNTHROID Take 25 mcg by mouth daily before breakfast.   norgestimate-ethinyl estradiol 0.25-35 MG-MCG tablet Commonly known as: Estarylla Take 1 tablet by mouth daily. Please call [phone] for a follow up appointment   oseltamivir 75 MG capsule Commonly known as: TAMIFLU Take 1 capsule (75 mg total) by mouth daily.   Thyroid 81.25 MG Tabs Take 1 tablet by mouth daily.   Vitamin B-12 2500 MCG Subl Place 1 tablet under the tongue daily.          REVIEW OF SYSTEMS: A comprehensive ROS was conducted with the patient and is negative except as per HPI    OBJECTIVE:  VS: BP 122/80 (BP Location: Left Arm, Patient Position: Sitting, Cuff Size: Normal)   Pulse 77   Ht 5\' 7"  (1.702 m)   Wt 204 lb 9.6 oz (92.8 kg)   LMP  (LMP Unknown)   SpO2 98%   BMI 32.04 kg/m    Wt Readings from Last 3 Encounters:  02/18/21 204 lb 9.6 oz (92.8 kg)  10/05/16 197 lb (89.4 kg)  09/23/16 195 lb (88.5 kg)  EXAM: General: Pt appears well and is in NAD  Neck: General: Supple without adenopathy. Thyroid: Thyroid size normal.  No goiter or nodules appreciated.  Lungs: Clear with good BS bilat with no rales, rhonchi, or wheezes  Heart: Auscultation: RRR.  Abdomen: Normoactive bowel sounds, soft, nontender, without masses or organomegaly palpable  Extremities:  BL LE: No pretibial edema normal ROM and strength.  Skin: Hair: Texture and amount normal with gender appropriate distribution Skin Inspection: No rashes, Skin Palpation: Skin temperature, texture, and thickness normal to palpation  Neuro: Cranial nerves: II - XII grossly intact  Motor: Normal strength throughout DTRs: 2+ and symmetric in UE without delay in relaxation phase   Mental Status: Judgment, insight: Intact Memory: Intact for recent and remote events Mood and affect: No depression, anxiety, or agitation     DATA REVIEWED: Results for Stephanie Lynch, Stephanie Lynch (MRN [id number]) as of 02/20/2021 09:38  Ref. Range 02/18/2021 16:26  VITD Latest Ref Range: 30.00 - 100.00 ng/mL 52.41  Vitamin B12 Latest Ref Range: 211 - 911 pg/mL 246  TSH Latest Ref Range: 0.35 - 5.50 uIU/mL 0.45      ASSESSMENT/PLAN/RECOMMENDATIONS:   Hashimoto's Thyroiditis :  - Pt with non-specific symptoms - Historically TSH has been low , we discussed increased risk of cardiac arrhythmia and increased bone resorption  with iatrogenic hyperthyroidism - - Pt educated extensively on the correct way to take levothyroxine (first thing in the morning with water, 30 minutes before eating or taking other medications). - Pt encouraged to double dose the following day if she were to miss a dose given long half-life of levothyroxine.   Medications : Levothyroxine 88 mcg daily    2. Fatigue :  - Multifactorial  - Vitamin D normal  - B12 at the lower end of normal, pt will be advised to start OTC B12     Continue vitamin D 7500 iu 3x a week  Start Vitamin B12 1000 mcg daily    F/U in 4 months      Signed electronically by: Lyndle Herrlich, MD  [facility] Endocrinology  [facility] [address] [place] [place], [place] [address] Phone: [phone] FAX: [phone]   CC: Sheliah Hatch, MD [address] [place] [address] Phone: [phone] Fax: [phone]   Return to Endocrinology clinic as below: No future appointments.

## 2021-02-18 NOTE — Patient Instructions (Signed)

## 2021-02-19 LAB — VITAMIN D 25 HYDROXY (VIT D DEFICIENCY, FRACTURES): VITD: 52.41 ng/mL (ref 30.00–100.00)

## 2021-02-19 LAB — TSH: TSH: 0.45 u[IU]/mL (ref 0.35–5.50)

## 2021-02-19 LAB — VITAMIN B12: Vitamin B-12: 246 pg/mL (ref 211–911)

## 2021-02-20 ENCOUNTER — Encounter: Payer: Self-pay | Admitting: Internal Medicine

## 2021-04-18 DIAGNOSIS — E063 Autoimmune thyroiditis: Secondary | ICD-10-CM | POA: Diagnosis not present

## 2021-04-18 DIAGNOSIS — E538 Deficiency of other specified B group vitamins: Secondary | ICD-10-CM | POA: Diagnosis not present

## 2021-04-18 DIAGNOSIS — N951 Menopausal and female climacteric states: Secondary | ICD-10-CM | POA: Diagnosis not present

## 2021-04-18 DIAGNOSIS — E039 Hypothyroidism, unspecified: Secondary | ICD-10-CM | POA: Diagnosis not present

## 2021-04-18 DIAGNOSIS — Z131 Encounter for screening for diabetes mellitus: Secondary | ICD-10-CM | POA: Diagnosis not present

## 2021-05-23 ENCOUNTER — Telehealth: Payer: Self-pay | Admitting: Family Medicine

## 2021-05-23 MED ORDER — LEVOTHYROXINE SODIUM 88 MCG PO TABS: 88.0000 ug | ORAL_TABLET | Freq: Every day | ORAL | 1 refills | Status: DC

## 2021-05-23 NOTE — Telephone Encounter (Signed)
Script sent  

## 2021-05-23 NOTE — Telephone Encounter (Signed)
Medication:  levothyroxine (SYNTHROID) 88 MCG tablet [[id number]]    Has the patient contacted their pharmacy? No. (If no, request that the patient contact the pharmacy for the refill.) (If yes, when and what did the pharmacy advise?)     Preferred Pharmacy (with phone number or street name):  [id number] [place], [place] - [address] AT [facility] [address]  [address] [place] [address]  Phone:  [phone]  Fax:  [phone]     Agent: Please be advised that RX refills may take up to 3 business days. We ask that you follow-up with your pharmacy.

## 2021-06-16 NOTE — Progress Notes (Signed)
Name: Stephanie Lynch  MRN/ DOB: [id number], Aug 13, 1970    Age/ Sex: 50 y.o., female     PCP: Sheliah Hatch, MD   Reason for Endocrinology Evaluation: Hypothyroidism     Initial Endocrinology Clinic Visit: 02/18/2021    PATIENT IDENTIFIER: Ms. DONNITA FARINA is a 50 y.o., female with a past medical history of Hypothyroidism. She has followed with [facility] since 02/18/2021 for consultative assistance with management of her Hypothyroidism.   HISTORICAL SUMMARY:   She has been diagnosed with hypothyroidism since her 20's due to Hashimoto's thyroiditis . Has been on LT-4 replacement since then , over the yrs  has tried been on Levothyroxine , synthroid and desiccated thyroid . She also had cytomel which causes hair thinning   Her main issue is weight gain  Has chronic fatigue and constipation      On bio- identical HRT through Dr. Billy Coast for the past spring 2022 Was seen by integrative health at some point    Maternal grand mother had hypothyroidism and a paternal great aunt with Graves' disease  Maternal grand mother had ovarian cancer      LOW VITAMIN B12: She was noted to low - normal at 246 pg/mL     SUBJECTIVE:     Today (06/17/2021):  Ms. Booton is here for hypothyroidism.   Weight has been stable  She is on estrogen /progesterone through Dr. Jorene Minors office.  Sleep is better and   Continues with constipation , weight gain, stress and anxiety    levothyroxine 88 mcg daily  She is on vitamin D 7500 iu 3x a week  Vitamin B12 1000 mcg daily   She has prediabetes . She was prescribed Metformin through integrative health, but has not started yet.    HISTORY:  Past Medical History:  Past Medical History:  Diagnosis Date   Thyroid disease    Past Surgical History: No past surgical history on file. Social History:  reports that she quit smoking about 15 years ago. Her smoking use included cigarettes. She has never used smokeless  tobacco. No history on file for alcohol use and drug use. Family History:  Family History  Problem Relation Age of Onset   Thyroid disease Maternal Grandmother      HOME MEDICATIONS: Allergies as of 06/17/2021       Reactions   Doxycycline Other (See Comments)   Upset stomach        Medication List        Accurate as of June 17, 2021  7:17 AM. If you have any questions, ask your nurse or doctor.          cholecalciferol 1000 units tablet Commonly known as: VITAMIN D Take 2,000 Units by mouth daily.   levothyroxine 88 MCG tablet Commonly known as: SYNTHROID Take 1 tablet (88 mcg total) by mouth daily before breakfast.   norgestimate-ethinyl estradiol 0.25-35 MG-MCG tablet Commonly known as: Estarylla Take 1 tablet by mouth daily. Please call [phone] for a follow up appointment   oseltamivir 75 MG capsule Commonly known as: TAMIFLU Take 1 capsule (75 mg total) by mouth daily.   Vitamin B-12 2500 MCG Subl Place 1 tablet under the tongue daily.          OBJECTIVE:   PHYSICAL EXAM: VS: BP 120/80 (BP Location: Left Arm, Patient Position: Sitting, Cuff Size: Small)   Pulse 93   Ht 5\' 7"  (1.702 m)   Wt 202 lb (91.6 kg)   SpO2  99%   BMI 31.64 kg/m    EXAM: General: Pt appears well and is in NAD  Neck: General: Supple without adenopathy. Thyroid: Thyroid size normal.  No goiter or nodules appreciated. No thyroid bruit.  Lungs: Clear with good BS bilat with no rales, rhonchi, or wheezes  Heart: Auscultation: RRR.  Abdomen: Normoactive bowel sounds, soft, nontender, without masses or organomegaly palpable  Extremities:  BL LE: No pretibial edema normal ROM and strength.  Mental Status: Judgment, insight: Intact Orientation: Oriented to time, place, and person Mood and affect: No depression, anxiety, or agitation     DATA REVIEWED:  Latest Reference Range & Units 06/19/21 11:54  TSH mIU/L 2.16    Latest Reference Range & Units 06/19/21 11:54   Vitamin D, 25-Hydroxy 30 - 100 ng/mL 48  Vitamin B12 200 - 1,100 pg/mL 425    ASSESSMENT / PLAN / RECOMMENDATIONS:   Hashimoto's Thyroiditis :   - Pt is biochemically euthyroid  - She takes levothyroxine appropriately  - NO changes today    Medications : Continue Levothyroxine 88 mcg daily   2. Low Vitamin B12   - Normal  Medications  Continue Vitamin B12 1000 mcg daily   3. Prediabetes :   - Her A1c was checked by integrative health, she believes it was either in the 5.0's or 6.0's. She was prescribed Metformin through integrative health. I have encouraged her to start it . Cautioned against GI side effects   F/U in 6 months    Signed electronically by: Lyndle Herrlich, MD  [facility] Endocrinology  [facility] [address] [place] [place], [place] [address] Phone: [phone] FAX: [phone]      CC: Sheliah Hatch, MD [address] [place] [address] Phone: [phone]  Fax: [phone]   Return to Endocrinology clinic as below: Future Appointments  Date Time Provider Department Center  06/17/2021  8:30 AM Urania Pearlman, Konrad Dolores, MD [facility]  08/22/2021 10:00 AM Olive Bass, FNP [facility]

## 2021-06-17 ENCOUNTER — Encounter: Payer: Self-pay | Admitting: Internal Medicine

## 2021-06-17 ENCOUNTER — Ambulatory Visit: Payer: BC Managed Care – PPO | Admitting: Internal Medicine

## 2021-06-17 VITALS — BP 120/80 | HR 93 | Ht 67.0 in | Wt 202.0 lb

## 2021-06-17 DIAGNOSIS — R7303 Prediabetes: Secondary | ICD-10-CM | POA: Insufficient documentation

## 2021-06-17 DIAGNOSIS — E063 Autoimmune thyroiditis: Secondary | ICD-10-CM

## 2021-06-17 DIAGNOSIS — E538 Deficiency of other specified B group vitamins: Secondary | ICD-10-CM

## 2021-06-17 NOTE — Patient Instructions (Signed)

## 2021-06-19 ENCOUNTER — Other Ambulatory Visit: Payer: BC Managed Care – PPO

## 2021-06-19 ENCOUNTER — Encounter: Payer: Self-pay | Admitting: Internal Medicine

## 2021-06-19 DIAGNOSIS — E063 Autoimmune thyroiditis: Secondary | ICD-10-CM

## 2021-06-19 DIAGNOSIS — E538 Deficiency of other specified B group vitamins: Secondary | ICD-10-CM

## 2021-06-19 DIAGNOSIS — E559 Vitamin D deficiency, unspecified: Secondary | ICD-10-CM | POA: Diagnosis not present

## 2021-06-20 LAB — VITAMIN B12: Vitamin B-12: 425 pg/mL (ref 200–1100)

## 2021-06-20 LAB — TSH: TSH: 2.16 mIU/L

## 2021-06-20 LAB — VITAMIN D 25 HYDROXY (VIT D DEFICIENCY, FRACTURES): Vit D, 25-Hydroxy: 48 ng/mL (ref 30–100)

## 2021-06-20 MED ORDER — LEVOTHYROXINE SODIUM 88 MCG PO TABS: 88.0000 ug | ORAL_TABLET | Freq: Every day | ORAL | 3 refills | Status: DC

## 2021-07-24 DIAGNOSIS — J0181 Other acute recurrent sinusitis: Secondary | ICD-10-CM | POA: Diagnosis not present

## 2021-07-24 DIAGNOSIS — J301 Allergic rhinitis due to pollen: Secondary | ICD-10-CM | POA: Diagnosis not present

## 2021-08-21 ENCOUNTER — Telehealth: Payer: Self-pay | Admitting: Family Medicine

## 2021-08-21 NOTE — Telephone Encounter (Signed)
Ok to transfer.  I wish her the best 

## 2021-08-21 NOTE — Telephone Encounter (Signed)
Pt would like to switch to [facility] for location reason.

## 2021-08-22 ENCOUNTER — Encounter: Payer: Self-pay | Admitting: Family

## 2021-08-22 ENCOUNTER — Ambulatory Visit: Payer: BC Managed Care – PPO | Admitting: Family

## 2021-08-22 VITALS — BP 148/80 | HR 78 | Temp 98.0°F | Ht 68.0 in | Wt 205.8 lb

## 2021-08-22 DIAGNOSIS — M791 Myalgia, unspecified site: Secondary | ICD-10-CM | POA: Diagnosis not present

## 2021-08-22 DIAGNOSIS — Z13220 Encounter for screening for lipoid disorders: Secondary | ICD-10-CM

## 2021-08-22 DIAGNOSIS — R7303 Prediabetes: Secondary | ICD-10-CM

## 2021-08-22 LAB — CBC WITH DIFFERENTIAL/PLATELET
Basophils Absolute: 0 10*3/uL (ref 0.0–0.1)
Basophils Relative: 1.3 % (ref 0.0–3.0)
Eosinophils Absolute: 0 10*3/uL (ref 0.0–0.7)
Eosinophils Relative: 1 % (ref 0.0–5.0)
HCT: 43.4 % (ref 36.0–46.0)
Hemoglobin: 14.1 g/dL (ref 12.0–15.0)
Lymphocytes Relative: 34.2 % (ref 12.0–46.0)
Lymphs Abs: 1.3 10*3/uL (ref 0.7–4.0)
MCHC: 32.5 g/dL (ref 30.0–36.0)
MCV: 96.1 fl (ref 78.0–100.0)
Monocytes Absolute: 0.4 10*3/uL (ref 0.1–1.0)
Monocytes Relative: 9.9 % (ref 3.0–12.0)
Neutro Abs: 2 10*3/uL (ref 1.4–7.7)
Neutrophils Relative %: 53.6 % (ref 43.0–77.0)
Platelets: 264 10*3/uL (ref 150.0–400.0)
RBC: 4.51 Mil/uL (ref 3.87–5.11)
RDW: 13.6 % (ref 11.5–15.5)
WBC: 3.7 10*3/uL — ABNORMAL LOW (ref 4.0–10.5)

## 2021-08-22 LAB — LIPID PANEL
Cholesterol: 206 mg/dL — ABNORMAL HIGH (ref 0–200)
HDL: 80.8 mg/dL (ref >39.00)
LDL Cholesterol: 108 mg/dL — ABNORMAL HIGH (ref 0–99)
NonHDL: 125.58
Total CHOL/HDL Ratio: 3
Triglycerides: 86 mg/dL (ref 0.0–149.0)
VLDL: 17.2 mg/dL (ref 0.0–40.0)

## 2021-08-22 LAB — COMPREHENSIVE METABOLIC PANEL
ALT: 13 U/L (ref 0–35)
AST: 16 U/L (ref 0–37)
Albumin: 4.8 g/dL (ref 3.5–5.2)
Alkaline Phosphatase: 87 U/L (ref 39–117)
BUN: 13 mg/dL (ref 6–23)
CO2: 33 mEq/L — ABNORMAL HIGH (ref 19–32)
Calcium: 9.7 mg/dL (ref 8.4–10.5)
Chloride: 103 mEq/L (ref 96–112)
Creatinine, Ser: 0.8 mg/dL (ref 0.40–1.20)
GFR: 86.57 mL/min (ref >60.00)
Glucose, Bld: 93 mg/dL (ref 70–99)
Potassium: 4.4 mEq/L (ref 3.5–5.1)
Sodium: 139 mEq/L (ref 135–145)
Total Bilirubin: 0.6 mg/dL (ref 0.2–1.2)
Total Protein: 7.5 g/dL (ref 6.0–8.3)

## 2021-08-22 LAB — HEMOGLOBIN A1C: Hgb A1c MFr Bld: 5.8 % (ref 4.6–6.5)

## 2021-08-22 LAB — SEDIMENTATION RATE: Sed Rate: 24 mm/hr — ABNORMAL HIGH (ref 0–20)

## 2021-08-22 NOTE — Progress Notes (Signed)
Stephanie Lynch is a 51 y.o. female with the following history as recorded in EpicCare:  Patient Active Problem List   Diagnosis Date Noted   Pre-diabetes 06/17/2021   Low vitamin B12 level 06/17/2021   Hashimoto's disease 02/18/2021   Bilateral plantar fasciitis 09/26/2015   Vitamin D deficiency 08/08/2015   Acute bacterial sinusitis 06/26/2015   Myalgia and myositis 04/04/2015   Neck swelling 04/01/2015   Rosacea 01/12/2014   Bleeding nose 09/29/2013   Anxiety and depression 05/18/2013   Eye pain 02/19/2012   Screening for malignant neoplasm of cervix 10/13/2011   Routine general medical examination at a health care facility 10/13/2011   KNEE PAIN 09/18/2010   Hypothyroidism 08/22/2006    Current Outpatient Medications  Medication Sig Dispense Refill   cholecalciferol (VITAMIN D) 1000 UNITS tablet Take 2,000 Units by mouth daily.     Cyanocobalamin (VITAMIN B-12) 2500 MCG SUBL Place 1 tablet under the tongue daily.     estradiol (VIVELLE-DOT) 0.0375 MG/24HR 1 patch 2 (two) times a week.     levothyroxine (SYNTHROID) 88 MCG tablet Take 1 tablet (88 mcg total) by mouth daily before breakfast. 90 tablet 3   progesterone (PROMETRIUM) 100 MG capsule Take 100 mg by mouth at bedtime.     metFORMIN (GLUCOPHAGE) 500 MG tablet Take 500 mg by mouth 2 (two) times daily. (Patient not taking: Reported on 06/17/2021)     No current facility-administered medications for this visit.    Allergies: Doxycycline  Past Medical History:  Diagnosis Date   Thyroid disease     No past surgical history on file.  Family History  Problem Relation Age of Onset   Thyroid disease Maternal Grandmother     Social History   Tobacco Use   Smoking status: Former    Years: 25.00    Types: Cigarettes    Quit date: 07/06/2005    Years since quitting: 16.1   Smokeless tobacco: Never  Substance Use Topics   Alcohol use: Not on file    Subjective:   Presents today as a new patient; last seen by her  former PCP in 2018; does see GYN regularly;  Has been under care of Integrative [facility] in the past few years- does not feel that she has been getting what she needs from that office lately; History of hypothyroidism- has seen endocrine in December 2022; per endocrine, stable results;  ? History of pre-diabetes- was prescribed Metformin by her integrative medicine today;     Objective:  Vitals:   08/22/21 1007  BP: (!) 148/80  Pulse: 78  Temp: 98 F (36.7 C)  SpO2: 99%  Weight: 205 lb 12.8 oz (93.4 kg)  Height: $Remove'5\' 8"'uJaRbtN$  (1.727 m)    General: Well developed, well nourished, in no acute distress  Skin : Warm and dry.  Head: Normocephalic and atraumatic  Eyes: Sclera and conjunctiva clear; pupils round and reactive to light; extraocular movements intact  Ears: External normal; canals clear; tympanic membranes normal  Oropharynx: Pink, supple. No suspicious lesions  Neck: Supple without thyromegaly, adenopathy  Lungs: Respirations unlabored; clear to auscultation bilaterally without wheeze, rales, rhonchi  CVS exam: normal rate and regular rhythm.  Neurologic: Alert and oriented; speech intact; face symmetrical; moves all extremities well; CNII-XII intact without focal deficit   Assessment:  1. Myalgia   2. Pre-diabetes   3. Lipid screening     Plan:  Patient is concerned about underlying inflammation that could be masked by treatment for hypothyroidism/ menopause; Update  ANA, sed rate, RF, Hgba1c, CBC, CMP, lipid panel today; Follow up to be determined- to consider GLP-1;  This visit occurred during the SARS-CoV-2 public health emergency.  Safety protocols were in place, including screening questions prior to the visit, additional usage of staff PPE, and extensive cleaning of exam room while observing appropriate contact time as indicated for disinfecting solutions.    No follow-ups on file.  Orders Placed This Encounter  Procedures   Antinuclear Antib (ANA)   Sedimentation  rate   Rheumatoid Factor   Hemoglobin A1c   CBC with Differential/Platelet   Comp Met (CMET)   Lipid panel    Requested Prescriptions    No prescriptions requested or ordered in this encounter

## 2021-08-24 LAB — ANA: Anti Nuclear Antibody (ANA): NEGATIVE

## 2021-08-24 LAB — RHEUMATOID FACTOR: Rheumatoid fact SerPl-aCnc: <14 IU/mL (ref <14)

## 2021-08-28 DIAGNOSIS — E039 Hypothyroidism, unspecified: Secondary | ICD-10-CM | POA: Diagnosis not present

## 2021-08-28 DIAGNOSIS — R5383 Other fatigue: Secondary | ICD-10-CM | POA: Diagnosis not present

## 2021-08-28 DIAGNOSIS — E079 Disorder of thyroid, unspecified: Secondary | ICD-10-CM | POA: Diagnosis not present

## 2021-08-28 DIAGNOSIS — E063 Autoimmune thyroiditis: Secondary | ICD-10-CM | POA: Diagnosis not present

## 2021-08-28 DIAGNOSIS — N959 Unspecified menopausal and perimenopausal disorder: Secondary | ICD-10-CM | POA: Diagnosis not present

## 2021-08-28 DIAGNOSIS — R635 Abnormal weight gain: Secondary | ICD-10-CM | POA: Diagnosis not present

## 2021-09-03 DIAGNOSIS — J342 Deviated nasal septum: Secondary | ICD-10-CM | POA: Diagnosis not present

## 2021-09-03 DIAGNOSIS — J0181 Other acute recurrent sinusitis: Secondary | ICD-10-CM | POA: Diagnosis not present

## 2021-10-18 DIAGNOSIS — J069 Acute upper respiratory infection, unspecified: Secondary | ICD-10-CM | POA: Diagnosis not present

## 2021-10-18 DIAGNOSIS — R0981 Nasal congestion: Secondary | ICD-10-CM | POA: Diagnosis not present

## 2021-10-18 DIAGNOSIS — H6503 Acute serous otitis media, bilateral: Secondary | ICD-10-CM | POA: Diagnosis not present

## 2021-12-13 DIAGNOSIS — R509 Fever, unspecified: Secondary | ICD-10-CM | POA: Diagnosis not present

## 2021-12-13 DIAGNOSIS — R519 Headache, unspecified: Secondary | ICD-10-CM | POA: Diagnosis not present

## 2021-12-13 DIAGNOSIS — U071 COVID-19: Secondary | ICD-10-CM | POA: Diagnosis not present

## 2021-12-16 DIAGNOSIS — U071 COVID-19: Secondary | ICD-10-CM | POA: Diagnosis not present

## 2021-12-23 ENCOUNTER — Encounter: Payer: Self-pay | Admitting: Internal Medicine

## 2021-12-23 ENCOUNTER — Ambulatory Visit: Payer: BC Managed Care – PPO | Admitting: Internal Medicine

## 2021-12-23 VITALS — BP 110/76 | HR 66 | Ht 68.0 in | Wt 200.2 lb

## 2021-12-23 DIAGNOSIS — E063 Autoimmune thyroiditis: Secondary | ICD-10-CM | POA: Diagnosis not present

## 2021-12-23 DIAGNOSIS — E538 Deficiency of other specified B group vitamins: Secondary | ICD-10-CM

## 2021-12-23 DIAGNOSIS — R7303 Prediabetes: Secondary | ICD-10-CM | POA: Diagnosis not present

## 2021-12-23 LAB — BASIC METABOLIC PANEL
BUN: 15 mg/dL (ref 6–23)
CO2: 30 mEq/L (ref 19–32)
Calcium: 9.8 mg/dL (ref 8.4–10.5)
Chloride: 99 mEq/L (ref 96–112)
Creatinine, Ser: 0.8 mg/dL (ref 0.40–1.20)
GFR: 86.36 mL/min (ref >60.00)
Glucose, Bld: 86 mg/dL (ref 70–99)
Potassium: 4.4 mEq/L (ref 3.5–5.1)
Sodium: 137 mEq/L (ref 135–145)

## 2021-12-23 LAB — HEMOGLOBIN A1C: Hgb A1c MFr Bld: 6.1 % (ref 4.6–6.5)

## 2021-12-23 LAB — VITAMIN D 25 HYDROXY (VIT D DEFICIENCY, FRACTURES): VITD: 89.7 ng/mL (ref 30.00–100.00)

## 2021-12-23 LAB — VITAMIN B12: Vitamin B-12: 725 pg/mL (ref 211–911)

## 2021-12-23 LAB — TSH: TSH: 0.79 u[IU]/mL (ref 0.35–5.50)

## 2021-12-23 MED ORDER — LEVOTHYROXINE SODIUM 88 MCG PO TABS
88.0000 ug | ORAL_TABLET | Freq: Every day | ORAL | 3 refills | Status: DC
Start: 2021-12-23 — End: 2023-01-25

## 2021-12-23 NOTE — Progress Notes (Signed)
Name: Stephanie Lynch  MRN/ DOB: [id number], 05-06-1971    Age/ Sex: 51 y.o., female     PCP: Olive Bass, FNP   Reason for Endocrinology Evaluation: Hypothyroidism     Initial Endocrinology Clinic Visit: 02/18/2021    PATIENT IDENTIFIER: Stephanie Lynch is a 39 y.o., female with a past medical history of Hypothyroidism. She has followed with [facility] since 02/18/2021 for consultative assistance with management of her Hypothyroidism.   HISTORICAL SUMMARY:   She has been diagnosed with hypothyroidism since her 20's due to Hashimoto's thyroiditis . Has been on LT-4 replacement since then , over the yrs  has tried been on Levothyroxine , synthroid and desiccated thyroid . She also had cytomel which causes hair thinning   Her main issue is weight gain  Has chronic fatigue and constipation      On bio- identical HRT through Dr. Billy Coast for the past spring 2022 Was seen by integrative health at some point    Maternal grand mother had hypothyroidism and a paternal great aunt with Graves' disease  Maternal grand mother had ovarian cancer      LOW VITAMIN B12: She was noted to low - normal at 246 pg/mL     SUBJECTIVE:     Today (12/23/2021):  Stephanie Lynch is here for hypothyroidism management .  Weight has been decreasing  Had COVID for the 2nd time  She is on estrogen /progesterone through Dr. Jorene Minors office.    Has chronic constipation   levothyroxine 88 mcg daily  She is on vitamin D 7500 iu 3x a week  Vitamin B12 1000 mcg daily   She has prediabetes . She was prescribed Metformin through integrative health, but has not started    HISTORY:  Past Medical History:  Past Medical History:  Diagnosis Date   Thyroid disease    Past Surgical History: No past surgical history on file. Social History:  reports that she quit smoking about 16 years ago. Her smoking use included cigarettes. She has never used smokeless tobacco. No history  on file for alcohol use and drug use. Family History:  Family History  Problem Relation Age of Onset   Thyroid disease Maternal Grandmother      HOME MEDICATIONS: Allergies as of 12/23/2021       Reactions   Doxycycline Other (See Comments)   Upset stomach        Medication List        Accurate as of December 23, 2021  2:24 PM. If you have any questions, ask your nurse or doctor.          STOP taking these medications    estradiol 0.0375 MG/24HR Commonly known as: VIVELLE-DOT Stopped by: Scarlette Shorts, MD   metFORMIN 500 MG tablet Commonly known as: GLUCOPHAGE Stopped by: Scarlette Shorts, MD   progesterone 100 MG capsule Commonly known as: PROMETRIUM Stopped by: Scarlette Shorts, MD       TAKE these medications    cholecalciferol 1000 units tablet Commonly known as: VITAMIN D Take 2,000 Units by mouth daily.   levothyroxine 88 MCG tablet Commonly known as: SYNTHROID Take 1 tablet (88 mcg total) by mouth daily before breakfast.   Vitamin B-12 2500 MCG Subl Place 1 tablet under the tongue daily.          OBJECTIVE:   PHYSICAL EXAM: VS: BP 110/76 (BP Location: Left Arm, Patient Position: Sitting, Cuff Size: Large)   Pulse 66  Ht 5\' 8"  (1.727 m)   Wt 200 lb 3.2 oz (90.8 kg)   SpO2 99%   BMI 30.44 kg/m    EXAM: General: Pt appears well and is in NAD  Neck: General: Supple without adenopathy. Thyroid: Thyroid size normal.  No goiter or nodules appreciated. No thyroid bruit.  Lungs: Clear with good BS bilat with no rales, rhonchi, or wheezes  Heart: Auscultation: RRR.  Abdomen: Normoactive bowel sounds, soft, nontender, without masses or organomegaly palpable  Extremities:  BL LE: No pretibial edema normal ROM and strength.  Mental Status: Judgment, insight: Intact Orientation: Oriented to time, place, and person Mood and affect: No depression, anxiety, or agitation     DATA REVIEWED:  Latest Reference Range & Units  12/23/21 10:17  Sodium 135 - 145 mEq/L 137  Potassium 3.5 - 5.1 mEq/L 4.4  Chloride 96 - 112 mEq/L 99  CO2 19 - 32 mEq/L 30  Glucose 70 - 99 mg/dL 86  BUN 6 - 23 mg/dL 15  Creatinine 12/25/21 - 4.40 mg/dL 3.47  Calcium 8.4 - 4.25 mg/dL 9.8  GFR 95.6 mL/min 86.36  VITD 30.00 - 100.00 ng/mL 89.70  Vitamin B12 211 - 911 pg/mL 725    Latest Reference Range & Units 12/23/21 10:17  Glucose 70 - 99 mg/dL 86  Hemoglobin 12/25/21 4.6 - 6.5 % 6.1  TSH 0.35 - 5.50 uIU/mL 0.79    ASSESSMENT / PLAN / RECOMMENDATIONS:   Hashimoto's Thyroiditis :   - Pt is biochemically euthyroid  - She takes levothyroxine appropriately  - No changes today    Medications : Continue Levothyroxine 88 mcg daily   2. Low Vitamin B12 / Hx Vitamin D insufficiency:  -Resolved -Vitamin D is at the upper limit of normal patient will be advised to reduce this Medications  Continue Vitamin B12 1000 mcg daily  Decrease vitamin D to 5000 iu , 3x a week  3. Prediabetes :   -She was diagnosed with this to integr[facility], they prescribed metformin but she has not started -Her A1c has trended up from 5.8% to 6.1% -She will be encouraged to start metformin as well as to continue lifestyle changes  F/U in 6 months    Signed electronically by: I4P, MD  [facility] End[facility]  [facility] [address] [place] [place], [place] Phone: [phone] FAX: [phone]      CC: [phone], FNP [address] [place] Phone: [phone]  Fax: [phone]   Return to Endocrinology clinic as below: Future Appointments  Date Time Provider Department Center  06/23/2022  2:00 PM Mykal Batiz, 06/25/2022, MD [facility] None

## 2021-12-27 ENCOUNTER — Encounter: Payer: Self-pay | Admitting: Internal Medicine

## 2022-01-12 DIAGNOSIS — N959 Unspecified menopausal and perimenopausal disorder: Secondary | ICD-10-CM | POA: Diagnosis not present

## 2022-01-12 DIAGNOSIS — E559 Vitamin D deficiency, unspecified: Secondary | ICD-10-CM | POA: Diagnosis not present

## 2022-01-12 DIAGNOSIS — R5383 Other fatigue: Secondary | ICD-10-CM | POA: Diagnosis not present

## 2022-01-12 DIAGNOSIS — E063 Autoimmune thyroiditis: Secondary | ICD-10-CM | POA: Diagnosis not present

## 2022-01-12 DIAGNOSIS — E039 Hypothyroidism, unspecified: Secondary | ICD-10-CM | POA: Diagnosis not present

## 2022-01-13 DIAGNOSIS — Z6832 Body mass index (BMI) 32.0-32.9, adult: Secondary | ICD-10-CM | POA: Diagnosis not present

## 2022-01-13 DIAGNOSIS — Z1231 Encounter for screening mammogram for malignant neoplasm of breast: Secondary | ICD-10-CM | POA: Diagnosis not present

## 2022-01-13 DIAGNOSIS — Z01419 Encounter for gynecological examination (general) (routine) without abnormal findings: Secondary | ICD-10-CM | POA: Diagnosis not present

## 2022-01-13 LAB — HM MAMMOGRAPHY

## 2022-01-20 DIAGNOSIS — E063 Autoimmune thyroiditis: Secondary | ICD-10-CM | POA: Diagnosis not present

## 2022-01-20 DIAGNOSIS — E559 Vitamin D deficiency, unspecified: Secondary | ICD-10-CM | POA: Diagnosis not present

## 2022-01-20 DIAGNOSIS — E039 Hypothyroidism, unspecified: Secondary | ICD-10-CM | POA: Diagnosis not present

## 2022-01-20 DIAGNOSIS — R5383 Other fatigue: Secondary | ICD-10-CM | POA: Diagnosis not present

## 2022-03-06 ENCOUNTER — Telehealth: Payer: Self-pay | Admitting: Family Medicine

## 2022-03-06 NOTE — Telephone Encounter (Signed)
Caller name: Verla Bryngelson   On DPR? :yes/no: Yes  Call back number: [phone]  Provider they see: Beverely Low   Reason for call: Pt called stating that Dr.Tabori use to be her pcp. Pt want to know if Dr.Tabori will take her back. Pt is aware that Tabori isn't taking NP right now.

## 2022-04-01 ENCOUNTER — Encounter (INDEPENDENT_AMBULATORY_CARE_PROVIDER_SITE_OTHER): Payer: Self-pay | Admitting: Family Medicine

## 2022-04-01 ENCOUNTER — Ambulatory Visit (INDEPENDENT_AMBULATORY_CARE_PROVIDER_SITE_OTHER): Payer: BC Managed Care – PPO | Admitting: Family Medicine

## 2022-04-01 VITALS — BP 122/76 | HR 68 | Temp 98.0°F | Ht 67.0 in | Wt 204.0 lb

## 2022-04-01 DIAGNOSIS — Z6832 Body mass index (BMI) 32.0-32.9, adult: Secondary | ICD-10-CM

## 2022-04-01 DIAGNOSIS — E669 Obesity, unspecified: Secondary | ICD-10-CM

## 2022-04-01 DIAGNOSIS — Z0289 Encounter for other administrative examinations: Secondary | ICD-10-CM

## 2022-04-01 DIAGNOSIS — R7303 Prediabetes: Secondary | ICD-10-CM

## 2022-04-01 NOTE — Progress Notes (Signed)
  Office: [phone]  /  Fax: [phone]  Initial Visit  Stephanie Lynch was seen in clinic today to evaluate for obesity. She is interested in losing weight to improve overall health and reduce the risk of weight related complications.   She presents today to review program treatment options, initial physical assessment, and evaluation.      Past medical history includes:   Past Medical History:  Diagnosis Date   Thyroid disease      Objective:   BP 122/76   Pulse 68   Temp 98 F (36.7 C)   Ht 5\' 7"  (1.702 m)   Wt 204 lb (92.5 kg)   SpO2 99%   BMI 31.95 kg/m  She was weighed on the bioimpedance scale:  Body mass index is 31.95 kg/m.  General:  Alert, oriented and cooperative. Patient is in no acute distress.  Respiratory: Normal respiratory effort, no problems with respiration noted  Extremities: Normal range of motion.    Mental Status: Normal mood and affect. Normal behavior. Normal judgment and thought content.   Assessment and Plan:  1. Prediabetes - EKG 12-Lead; Future  2. Class 1 obesity with serious comorbidity and body mass index (BMI) of 32.0 to 32.9 in adult, unspecified obesity type      Obesity Treatment Plan:  She will work on garnering support from family and friends to begin weight loss journey. Work on eliminating or reducing the presence of highly processed, calorie dense foods in the home. Complete provided nutritional and psychosocial assessment questionnaire.    Stephanie Lynch will follow up in the next 1-2 weeks to review the above steps and continue evaluation and treatment.  Obesity Education Performed Today:  She was weighed on the bioimpedance scale and results were discussed and documented in the synopsis.  We discussed obesity as a disease and the importance of a more detailed evaluation of all the factors contributing to the disease.  We discussed the importance of long term lifestyle changes which include nutrition, exercise  and behavioral modifications as well as the importance of customizing this to her specific health and social needs.  We discussed the benefits of reaching a healthier weight to alleviate the symptoms of existing conditions and reduce the risks of the biomechanical, metabolic and psychological effects of obesity.  We discussed the goals of this program is to improve her overall health and not simply achieve a specific BMI.  Frequent visits are very important to patient success. I plan to see her every 2 weeks for the first 3 months and then evaluate the visit frequency after that time. I explained obesity is a life-long chronic disease and long term treatments would be required. Medications to help her follow his eating plan may be offered as appropriate but are not required. All medication decisions will be made together after the initial workup is done and benefits and side effects are discussed in depth.  The clinic rules were reviewed including the late policy, cancellation policy, no show and program fees.  Stephanie Lynch appears to be in the action stage of change and states they are ready to start intensive lifestyle modifications and behavioral modifications.  45 minutes was spent today on this visit including the above counseling, pre-visit chart review, and post-visit documentation.  Dennard Nip, MD

## 2022-04-08 ENCOUNTER — Ambulatory Visit (INDEPENDENT_AMBULATORY_CARE_PROVIDER_SITE_OTHER): Payer: BC Managed Care – PPO | Admitting: Family Medicine

## 2022-04-08 ENCOUNTER — Encounter (INDEPENDENT_AMBULATORY_CARE_PROVIDER_SITE_OTHER): Payer: Self-pay | Admitting: Family Medicine

## 2022-04-08 VITALS — BP 125/79 | HR 74 | Temp 98.0°F | Ht 67.0 in | Wt 204.0 lb

## 2022-04-08 DIAGNOSIS — E785 Hyperlipidemia, unspecified: Secondary | ICD-10-CM | POA: Diagnosis not present

## 2022-04-08 DIAGNOSIS — R7989 Other specified abnormal findings of blood chemistry: Secondary | ICD-10-CM | POA: Diagnosis not present

## 2022-04-08 DIAGNOSIS — I10 Essential (primary) hypertension: Secondary | ICD-10-CM | POA: Insufficient documentation

## 2022-04-08 DIAGNOSIS — R7303 Prediabetes: Secondary | ICD-10-CM | POA: Diagnosis not present

## 2022-04-08 DIAGNOSIS — E669 Obesity, unspecified: Secondary | ICD-10-CM | POA: Insufficient documentation

## 2022-04-08 DIAGNOSIS — E559 Vitamin D deficiency, unspecified: Secondary | ICD-10-CM | POA: Diagnosis not present

## 2022-04-08 DIAGNOSIS — Z6832 Body mass index (BMI) 32.0-32.9, adult: Secondary | ICD-10-CM

## 2022-04-08 DIAGNOSIS — R5383 Other fatigue: Secondary | ICD-10-CM | POA: Insufficient documentation

## 2022-04-08 DIAGNOSIS — R0602 Shortness of breath: Secondary | ICD-10-CM

## 2022-04-08 DIAGNOSIS — E063 Autoimmune thyroiditis: Secondary | ICD-10-CM | POA: Diagnosis not present

## 2022-04-08 DIAGNOSIS — Z1331 Encounter for screening for depression: Secondary | ICD-10-CM

## 2022-04-09 LAB — CMP14+EGFR
ALT: 11 IU/L (ref 0–32)
AST: 17 IU/L (ref 0–40)
Albumin/Globulin Ratio: 2.2 (ref 1.2–2.2)
Albumin: 5 g/dL — ABNORMAL HIGH (ref 3.9–4.9)
Alkaline Phosphatase: 105 IU/L (ref 44–121)
BUN/Creatinine Ratio: 15 (ref 9–23)
BUN: 12 mg/dL (ref 6–24)
Bilirubin Total: 0.4 mg/dL (ref 0.0–1.2)
CO2: 24 mmol/L (ref 20–29)
Calcium: 9.8 mg/dL (ref 8.7–10.2)
Chloride: 103 mmol/L (ref 96–106)
Creatinine, Ser: 0.82 mg/dL (ref 0.57–1.00)
Globulin, Total: 2.3 g/dL (ref 1.5–4.5)
Glucose: 94 mg/dL (ref 70–99)
Potassium: 4.5 mmol/L (ref 3.5–5.2)
Sodium: 143 mmol/L (ref 134–144)
Total Protein: 7.3 g/dL (ref 6.0–8.5)
eGFR: 88 mL/min/{1.73_m2} (ref >59)

## 2022-04-09 LAB — CBC WITH DIFFERENTIAL/PLATELET
Basophils Absolute: 0 10*3/uL (ref 0.0–0.2)
Basos: 1 %
EOS (ABSOLUTE): 0.1 10*3/uL (ref 0.0–0.4)
Eos: 1 %
Hematocrit: 44 % (ref 34.0–46.6)
Hemoglobin: 14.7 g/dL (ref 11.1–15.9)
Immature Grans (Abs): 0 10*3/uL (ref 0.0–0.1)
Immature Granulocytes: 0 %
Lymphocytes Absolute: 1.3 10*3/uL (ref 0.7–3.1)
Lymphs: 34 %
MCH: 31.5 pg (ref 26.6–33.0)
MCHC: 33.4 g/dL (ref 31.5–35.7)
MCV: 94 fL (ref 79–97)
Monocytes Absolute: 0.4 10*3/uL (ref 0.1–0.9)
Monocytes: 9 %
Neutrophils Absolute: 2.1 10*3/uL (ref 1.4–7.0)
Neutrophils: 55 %
Platelets: 259 10*3/uL (ref 150–450)
RBC: 4.66 x10E6/uL (ref 3.77–5.28)
RDW: 12.6 % (ref 11.7–15.4)
WBC: 3.8 10*3/uL (ref 3.4–10.8)

## 2022-04-09 LAB — HEMOGLOBIN A1C
Est. average glucose Bld gHb Est-mCnc: 120 mg/dL
Hgb A1c MFr Bld: 5.8 % — ABNORMAL HIGH (ref 4.8–5.6)

## 2022-04-09 LAB — LIPID PANEL WITH LDL/HDL RATIO
Cholesterol, Total: 202 mg/dL — ABNORMAL HIGH (ref 100–199)
HDL: 86 mg/dL (ref >39)
LDL Chol Calc (NIH): 103 mg/dL — ABNORMAL HIGH (ref 0–99)
LDL/HDL Ratio: 1.2 ratio (ref 0.0–3.2)
Triglycerides: 75 mg/dL (ref 0–149)
VLDL Cholesterol Cal: 13 mg/dL (ref 5–40)

## 2022-04-09 LAB — VITAMIN B12: Vitamin B-12: 487 pg/mL (ref 232–1245)

## 2022-04-09 LAB — VITAMIN D 25 HYDROXY (VIT D DEFICIENCY, FRACTURES): Vit D, 25-Hydroxy: 38.3 ng/mL (ref 30.0–100.0)

## 2022-04-09 LAB — TSH: TSH: 3.34 u[IU]/mL (ref 0.450–4.500)

## 2022-04-09 LAB — INSULIN, RANDOM: INSULIN: 16.9 u[IU]/mL (ref 2.6–24.9)

## 2022-04-14 NOTE — Progress Notes (Signed)
Chief Complaint:   Stephanie Lynch (MR# [id number]) is a 51 y.o. female who presents for evaluation and treatment of obesity and related comorbidities. Current BMI is Body mass index is 31.95 kg/m. Stephanie Lynch has been struggling with her weight for many years and has been unsuccessful in either losing weight, maintaining weight loss, or reaching her healthy weight goal.  Stephanie Lynch reports some cravings and difficulty with meal planning and dining out, and grab and go foods.   Stephanie Lynch is currently in the action stage of change and ready to dedicate time achieving and maintaining a healthier weight. [name] is interested in becoming our patient and working on intensive lifestyle modifications including (but not limited to) diet and exercise for weight loss.  Stephanie Lynch's habits were reviewed today and are as follows: Her family eats meals together, her desired weight loss is 29 lbs, she started gaining weight within the last 5 to 7 years, her heaviest weight ever was 205 pounds, she skips meals frequently, she is frequently drinking liquids with calories, she has problems with excessive hunger, she frequently eats larger portions than normal, and she struggles with emotional eating.  Depression Screen Stephanie Lynch's Food and Mood (modified PHQ-9) score was 6.     04/08/2022    7:13 AM  Depression screen PHQ 2/9  Decreased Interest 1  Down, Depressed, Hopeless 1  PHQ - 2 Score 2  Altered sleeping 0  Tired, decreased energy 1  Change in appetite 1  Feeling bad or failure about yourself  1  Trouble concentrating 1  Moving slowly or fidgety/restless 0  Suicidal thoughts 0  PHQ-9 Score 6  Difficult doing work/chores Not difficult at all   Subjective:   1. Other fatigue [name] admits to daytime somnolence and admits to waking up still tired. Patient has a history of symptoms of daytime fatigue and morning fatigue. Stephanie Lynch generally gets 7 or 8 hours of sleep per night, and states that  she has nightime awakenings and generally restful sleep. Snoring is present. Apneic episodes are not present. Epworth Sleepiness Score is 3.   2. SOBOE (shortness of breath on exertion) Stephanie Lynch notes increasing shortness of breath with exercising and seems to be worsening over time with weight gain. She notes getting out of breath sooner with activity than she used to. This has not gotten worse recently. Stephanie Lynch denies shortness of breath at rest or orthopnea.  3. Vitamin D deficiency Stephanie Lynch takes Vitamin D 7,500 IU 3 times per week. No side effects were noted.   4. Hashimoto's disease Stephanie Lynch is taking Synthroid 88 mcg daily, with no side effects noted.   5. Prediabetes Stephanie Lynch is not on metformin, and she notes a family history of diabetes mellitus. We discussed the nature of pre-diabetes and the effects on her hunger.   6. Hyperlipidemia, unspecified hyperlipidemia type [name] is not on statin, and her last LDL was not at goal. She is ready to work on her diet to improve.   7. Low vitamin B12 level Stephanie Lynch is taking B12 2500 mcg daily as needed.   Assessment/Plan:   1. Other fatigue Stephanie Lynch does feel that her weight is causing her energy to be lower than it should be. Fatigue may be related to obesity, depression or many other causes. Labs will be ordered, and in the meanwhile, Stephanie Lynch will focus on self care including making healthy food choices, increasing physical activity and focusing on stress reduction.  - EKG 12-Lead - CBC with Differential/Platelet  2.  SOBOE (shortness of breath on exertion) Stephanie Lynch does feel that she gets out of breath more easily that she used to when she exercises. Stephanie Lynch's shortness of breath appears to be obesity related and exercise induced. She has agreed to work on weight loss and gradually increase exercise to treat her exercise induced shortness of breath. Will continue to monitor closely.  3. Vitamin D deficiency We will check labs today.  Stephanie Lynch will follow-up for routine testing of Vitamin D, at least 2-3 times per year to avoid over-replacement.  - VITAMIN D 25 Hydroxy (Vit-D Deficiency, Fractures)  4. Hashimoto's disease We will check labs today, and Stephanie Lynch will work on her diet and exercise.   - TSH  5. Prediabetes We will check labs today. Stephanie Lynch will work on her diet, exercise, and decreasing simple carbohydrates to help decrease the risk of diabetes.   - CMP14+EGFR - Insulin, random - Hemoglobin A1c  6. Hyperlipidemia, unspecified hyperlipidemia type We will check labs today. Stephanie Lynch will continue to work on diet, exercise, and weight loss efforts. Orders and follow up as documented in patient record.   - Lipid Panel With LDL/HDL Ratio  7. Low vitamin B12 level We will check labs today, and [name] will continue supplementation as indicated.   - Vitamin B12  8. Depression screening Stephanie Lynch had a positive depression screening. Depression is commonly associated with obesity and often results in emotional eating behaviors. We will monitor this closely and work on CBT to help improve the non-hunger eating patterns. Referral to Psychology may be required if no improvement is seen as she continues in our clinic.  9. Class 1 obesity with serious comorbidity and body mass index (BMI) of 32.0 to 32.9 in adult, unspecified obesity type Stephanie Lynch is currently in the action stage of change and her goal is to continue with weight loss efforts. I recommend [name] begin the structured treatment plan as follows:  She has agreed to the Category 2 Plan + 100 calories.   Exercise goals: As is.    Behavioral modification strategies: increasing lean protein intake, decreasing simple carbohydrates, decreasing eating out, no skipping meals, and meal planning and cooking strategies.  She was informed of the importance of frequent follow-up visits to maximize her success with intensive lifestyle modifications for her multiple  health conditions. She was informed we would discuss her lab results at her next visit unless there is a critical issue that needs to be addressed sooner. Stephanie Lynch agreed to keep her next visit at the agreed upon time to discuss these results.  Objective:   Blood pressure 125/79, pulse 74, temperature 98 F (36.7 C), height _0  (1.702 m), weight 204 lb (92.5 kg), SpO2 91 %. Body mass index is 31.95 kg/m.  EKG: Normal sinus rhythm, rate 67 BPM.  Indirect Calorimeter completed today shows a VO2 of 221 and a REE of 1526.  Her calculated basal metabolic rate is 1610 thus her basal metabolic rate is worse than expected.  General: Cooperative, alert, well developed, in no acute distress. HEENT: Conjunctivae and lids unremarkable. Cardiovascular: Regular rhythm.  Lungs: Normal work of breathing. Neurologic: No focal deficits.   Lab Results  Component Value Date   CREATININE 0.82 04/08/2022   BUN 12 04/08/2022   NA 143 04/08/2022   K 4.5 04/08/2022   CL 103 04/08/2022   CO2 24 04/08/2022   Lab Results  Component Value Date   ALT 11 04/08/2022   AST 17 04/08/2022   ALKPHOS 105 04/08/2022  BILITOT 0.4 04/08/2022   Lab Results  Component Value Date   HGBA1C 5.8 (H) 04/08/2022   HGBA1C 6.1 12/23/2021   HGBA1C 5.8 08/22/2021   Lab Results  Component Value Date   INSULIN 16.9 04/08/2022   Lab Results  Component Value Date   TSH 3.340 04/08/2022   Lab Results  Component Value Date   CHOL 202 (H) 04/08/2022   HDL 86 04/08/2022   LDLCALC 103 (H) 04/08/2022   LDLDIRECT 137.8 09/18/2010   TRIG 75 04/08/2022   CHOLHDL 3 08/22/2021   Lab Results  Component Value Date   WBC 3.8 04/08/2022   HGB 14.7 04/08/2022   HCT 44.0 04/08/2022   MCV 94 04/08/2022   PLT 259 04/08/2022   No results found for: "IRON", "TIBC", "FERRITIN"  Attestation Statements:   Reviewed by clinician on day of visit: allergies, medications, problem list, medical history, surgical history, family  history, social history, and previous encounter notes.  Time spent on visit including pre-visit chart review and post-visit charting and care was 43 minutes.   I, Trixie Dredge, am acting as transcriptionist for Dennard Nip, MD.  I have reviewed the above documentation for accuracy and completeness, and I agree with the above. - Dennard Nip, MD

## 2022-04-21 ENCOUNTER — Encounter (INDEPENDENT_AMBULATORY_CARE_PROVIDER_SITE_OTHER): Payer: Self-pay | Admitting: Family Medicine

## 2022-04-21 ENCOUNTER — Other Ambulatory Visit (INDEPENDENT_AMBULATORY_CARE_PROVIDER_SITE_OTHER): Payer: Self-pay | Admitting: Family Medicine

## 2022-04-21 ENCOUNTER — Ambulatory Visit (INDEPENDENT_AMBULATORY_CARE_PROVIDER_SITE_OTHER): Payer: BC Managed Care – PPO | Admitting: Family Medicine

## 2022-04-21 VITALS — BP 131/72 | HR 75 | Temp 98.6°F | Ht 67.0 in | Wt 202.0 lb

## 2022-04-21 DIAGNOSIS — Z6831 Body mass index (BMI) 31.0-31.9, adult: Secondary | ICD-10-CM

## 2022-04-21 DIAGNOSIS — E559 Vitamin D deficiency, unspecified: Secondary | ICD-10-CM

## 2022-04-21 DIAGNOSIS — E7800 Pure hypercholesterolemia, unspecified: Secondary | ICD-10-CM

## 2022-04-21 DIAGNOSIS — E669 Obesity, unspecified: Secondary | ICD-10-CM

## 2022-04-21 DIAGNOSIS — R7303 Prediabetes: Secondary | ICD-10-CM | POA: Diagnosis not present

## 2022-04-21 DIAGNOSIS — E039 Hypothyroidism, unspecified: Secondary | ICD-10-CM

## 2022-04-21 DIAGNOSIS — E66811 Obesity, class 1: Secondary | ICD-10-CM

## 2022-04-21 DIAGNOSIS — R7989 Other specified abnormal findings of blood chemistry: Secondary | ICD-10-CM | POA: Diagnosis not present

## 2022-04-21 DIAGNOSIS — K5909 Other constipation: Secondary | ICD-10-CM | POA: Diagnosis not present

## 2022-04-21 MED ORDER — VITAMIN D (ERGOCALCIFEROL) 1.25 MG (50000 UNIT) PO CAPS: 50000.0000 [IU] | ORAL_CAPSULE | ORAL | 0 refills | Status: DC

## 2022-04-21 MED ORDER — METFORMIN HCL 500 MG PO TABS: 500.0000 mg | ORAL_TABLET | Freq: Every day | ORAL | 0 refills | Status: DC

## 2022-04-22 ENCOUNTER — Ambulatory Visit (INDEPENDENT_AMBULATORY_CARE_PROVIDER_SITE_OTHER): Payer: Self-pay | Admitting: Family Medicine

## 2022-04-27 NOTE — Progress Notes (Signed)
Chief Complaint:   OBESITY Stephanie Lynch is here to discuss her progress with her obesity treatment plan along with follow-up of her obesity related diagnoses. Stephanie Lynch is on the Category 2 Plan + 100 calories and states she is following her eating plan approximately 85% of the time. Stephanie Lynch states she is doing 0 minutes 0 times per week.  Today's visit was #: 2 Starting weight: 204 lbs Starting date: 04/08/2022 Today's weight: 202 lbs Today's date: 04/21/2022 Total lbs lost to date: 2 Total lbs lost since last in-office visit: 2  Interim History: Stephanie Lynch has done well with weight loss.  She did well with breakfast and lunch, but dinner is still a little challenging with meal planning and prepping.  She is meeting her protein goals.  Subjective:   1. Prediabetes Stephanie Lynch's recent A1c was 5.8 and insulin 16.9.  We discussed the role of insulin resistance, effects on fat storage, and increased hunger.  We discussed the use of metformin.  I discussed labs with the patient today.  2. Hypothyroidism, unspecified type Stephanie Lynch is taking Synthroid, and her last TSH was 3.54, at goal.  I discussed labs with the patient today.  3. Low vitamin B12 level Stephanie Lynch's recent B12 level was 487, near goal.  She is taking B12 and she is working on her healthy eating diet, B12 rich diet.  I discussed labs with the patient today.  4. Other constipation Stephanie Lynch is taking MiraLAX and she takes magnesium.  She has been having chronic issues and she has tried Linzess in the past in preparation for scope.  5. Pure hypercholesterolemia Stephanie Lynch's recent LDL was 103, HDL 86, near goal.  She is not on statin.  I discussed labs with the patient today.  6. Vitamin D deficiency Stephanie Lynch's recent vitamin D level was 38.3, not at goal.  I discussed labs with the patient today.  Assessment/Plan:   1. Prediabetes Stephanie Lynch agreed to start metformin 500 mg once daily, with no refills.  (No menses in the past 5 years  postmenopausal). She will continue with her healthy eating plan.   - metFORMIN (GLUCOPHAGE) 500 MG tablet; Take 1 tablet (500 mg total) by mouth daily.  Dispense: 30 tablet; Refill: 0  2. Hypothyroidism, unspecified type Stephanie Lynch will continue Synthroid, and we will continue with her healthy eating plan.  3. Low vitamin B12 level Stephanie Lynch will continue her B12 supplementation and her healthy eating plan.  We will recheck labs in 3 to 4 months.  4. Other constipation Stephanie Lynch plans to follow-up with GI for chronic constipation issues.  5. Pure hypercholesterolemia Stephanie Lynch will continue with her healthy eating plan and exercise.  We will recheck labs in 3 to 4 months.  6. Vitamin D deficiency Stephanie Lynch agreed to start prescription vitamin D 50,000 IU once weekly, with no refills.  We will recheck labs in 3 to 4 months.  - Vitamin D, Ergocalciferol, (DRISDOL) 1.25 MG (50000 UNIT) CAPS capsule; Take 1 capsule (50,000 Units total) by mouth every 7 (seven) days.  Dispense: 5 capsule; Refill: 0  7. Obesity, Current BMI 31.6 Stephanie Lynch is currently in the action stage of change. As such, her goal is to continue with weight loss efforts. She has agreed to the Category 2 Plan.   Exercise goals: All adults should avoid inactivity. Some physical activity is better than none, and adults who participate in any amount of physical activity gain some health benefits.  Behavioral modification strategies: increasing lean protein intake, decreasing simple carbohydrates, meal planning  and cooking strategies, and planning for success.  Stephanie Lynch has agreed to follow-up with our clinic in 2 weeks. She was informed of the importance of frequent follow-up visits to maximize her success with intensive lifestyle modifications for her multiple health conditions.   Objective:   Blood pressure 131/72, pulse 75, temperature 98.6 F (37 C), height 5\' 7"  (1.702 m), weight 202 lb (91.6 kg), SpO2 99 %. Body mass index is 31.64  kg/m.  General: Cooperative, alert, well developed, in no acute distress. HEENT: Conjunctivae and lids unremarkable. Cardiovascular: Regular rhythm.  Lungs: Normal work of breathing. Neurologic: No focal deficits.   Lab Results  Component Value Date   CREATININE 0.82 04/08/2022   BUN 12 04/08/2022   NA 143 04/08/2022   K 4.5 04/08/2022   CL 103 04/08/2022   CO2 24 04/08/2022   Lab Results  Component Value Date   ALT 11 04/08/2022   AST 17 04/08/2022   ALKPHOS 105 04/08/2022   BILITOT 0.4 04/08/2022   Lab Results  Component Value Date   HGBA1C 5.8 (H) 04/08/2022   HGBA1C 6.1 12/23/2021   HGBA1C 5.8 08/22/2021   Lab Results  Component Value Date   INSULIN 16.9 04/08/2022   Lab Results  Component Value Date   TSH 3.340 04/08/2022   Lab Results  Component Value Date   CHOL 202 (H) 04/08/2022   HDL 86 04/08/2022   LDLCALC 103 (H) 04/08/2022   LDLDIRECT 137.8 09/18/2010   TRIG 75 04/08/2022   CHOLHDL 3 08/22/2021   Lab Results  Component Value Date   VD25OH 38.3 04/08/2022   VD25OH 89.70 12/23/2021   VD25OH 48 06/19/2021   Lab Results  Component Value Date   WBC 3.8 04/08/2022   HGB 14.7 04/08/2022   HCT 44.0 04/08/2022   MCV 94 04/08/2022   PLT 259 04/08/2022   No results found for: "IRON", "TIBC", "FERRITIN"  Attestation Statements:   Reviewed by clinician on day of visit: allergies, medications, problem list, medical history, surgical history, family history, social history, and previous encounter notes.   I, Trixie Dredge, am acting as transcriptionist for Dennard Nip, MD.  I have reviewed the above documentation for accuracy and completeness, and I agree with the above. -  Dennard Nip, MD

## 2022-05-06 ENCOUNTER — Encounter (INDEPENDENT_AMBULATORY_CARE_PROVIDER_SITE_OTHER): Payer: Self-pay | Admitting: Family Medicine

## 2022-05-06 ENCOUNTER — Ambulatory Visit (INDEPENDENT_AMBULATORY_CARE_PROVIDER_SITE_OTHER): Payer: BC Managed Care – PPO | Admitting: Family Medicine

## 2022-05-06 VITALS — BP 132/80 | HR 81 | Temp 97.5°F | Ht 67.0 in | Wt 201.0 lb

## 2022-05-06 DIAGNOSIS — Z6831 Body mass index (BMI) 31.0-31.9, adult: Secondary | ICD-10-CM

## 2022-05-06 DIAGNOSIS — E88819 Insulin resistance, unspecified: Secondary | ICD-10-CM | POA: Diagnosis not present

## 2022-05-06 DIAGNOSIS — K5909 Other constipation: Secondary | ICD-10-CM | POA: Diagnosis not present

## 2022-05-06 DIAGNOSIS — E559 Vitamin D deficiency, unspecified: Secondary | ICD-10-CM | POA: Diagnosis not present

## 2022-05-06 DIAGNOSIS — F439 Reaction to severe stress, unspecified: Secondary | ICD-10-CM

## 2022-05-06 DIAGNOSIS — E669 Obesity, unspecified: Secondary | ICD-10-CM

## 2022-05-06 MED ORDER — METFORMIN HCL 500 MG PO TABS: 500.0000 mg | ORAL_TABLET | Freq: Every day | ORAL | 0 refills | Status: DC

## 2022-05-06 MED ORDER — VITAMIN D (ERGOCALCIFEROL) 1.25 MG (50000 UNIT) PO CAPS: 50000.0000 [IU] | ORAL_CAPSULE | ORAL | 0 refills | Status: DC

## 2022-05-18 NOTE — Progress Notes (Unsigned)
Chief Complaint:   OBESITY Stephanie Lynch is here to discuss her progress with her obesity treatment plan along with follow-up of her obesity related diagnoses. Stephanie Lynch is on the Category 2 Plan and states she is following her eating plan approximately 50% of the time. Stephanie Lynch states she is walking for 30-40 minutes 2-3 times per week.  Today's visit was #: 3 Starting weight: 204 lbs Starting date: 04/08/2022 Today's weight: 201 lbs Today's date: 05/06/2022 Total lbs lost to date: 3 Total lbs lost since last in-office visit: 1  Interim History: Stephanie Lynch has done well with weight loss. She is focusing on getting her protein in. She had some celebration eating with her birthday. She notes she has been very busy and stressed over the past 2 weeks. She is struggling to get in her breakfast.   Subjective:   1. Insulin resistance Stephanie Lynch is not taking metformin consistently, as she is not always consistent with her breakfast.  We discussed breakfast options and taking metformin with her first meal.  2. Vitamin D deficiency Stephanie Lynch is taking vitamin D 50,000 units weekly with no side effects noted.  3. Other constipation Stephanie Lynch is taking align probiotic and she feels this is helping.  I encouraged her to hydrate well.  4. Stress We discussed family stressors and Stephanie Lynch has the tendency to snack to relieve stress.  Assessment/Plan:   1. Insulin resistance We will refill metformin for 1 month. Stephanie Lynch will continue her eating plan and exercise.   - metFORMIN (GLUCOPHAGE) 500 MG tablet; Take 1 tablet (500 mg total) by mouth daily.  Dispense: 30 tablet; Refill: 0  2. Vitamin D deficiency Stephanie Lynch will continue prescription vitamin D 50,000 IU every week, and we will refill for 1 month.  - Vitamin D, Ergocalciferol, (DRISDOL) 1.25 MG (50000 UNIT) CAPS capsule; Take 1 capsule (50,000 Units total) by mouth every 7 (seven) days.  Dispense: 5 capsule; Refill: 0  3. Other constipation Stephanie Lynch  will continue align probiotic, and we will continue to monitor.  4. Stress We discussed strategies to manage stressors and snacking.  We will continue to monitor.  5. Obesity, Current BMI 31.5 Stephanie Lynch is currently in the action stage of change. As such, her goal is to continue with weight loss efforts. She has agreed to the Category 2 Plan.   Breakfast options were provided.   Exercise goals: As is.   Behavioral modification strategies: increasing lean protein intake, decreasing simple carbohydrates, increasing water intake, no skipping meals, and meal planning and cooking strategies.  Stephanie Lynch has agreed to follow-up with our clinic in 2 weeks. She was informed of the importance of frequent follow-up visits to maximize her success with intensive lifestyle modifications for her multiple health conditions.   Objective:   Blood pressure 132/80, pulse 81, temperature (!) 97.5 F (36.4 C), height 5\' 7"  (1.702 m), weight 201 lb (91.2 kg), SpO2 99 %. Body mass index is 31.48 kg/m.  General: Cooperative, alert, well developed, in no acute distress. HEENT: Conjunctivae and lids unremarkable. Cardiovascular: Regular rhythm.  Lungs: Normal work of breathing. Neurologic: No focal deficits.   Lab Results  Component Value Date   CREATININE 0.82 04/08/2022   BUN 12 04/08/2022   NA 143 04/08/2022   K 4.5 04/08/2022   CL 103 04/08/2022   CO2 24 04/08/2022   Lab Results  Component Value Date   ALT 11 04/08/2022   AST 17 04/08/2022   ALKPHOS 105 04/08/2022   BILITOT 0.4 04/08/2022  Lab Results  Component Value Date   HGBA1C 5.8 (H) 04/08/2022   HGBA1C 6.1 12/23/2021   HGBA1C 5.8 08/22/2021   Lab Results  Component Value Date   INSULIN 16.9 04/08/2022   Lab Results  Component Value Date   TSH 3.340 04/08/2022   Lab Results  Component Value Date   CHOL 202 (H) 04/08/2022   HDL 86 04/08/2022   LDLCALC 103 (H) 04/08/2022   LDLDIRECT 137.8 09/18/2010   TRIG 75 04/08/2022    CHOLHDL 3 08/22/2021   Lab Results  Component Value Date   VD25OH 38.3 04/08/2022   VD25OH 89.70 12/23/2021   VD25OH 48 06/19/2021   Lab Results  Component Value Date   WBC 3.8 04/08/2022   HGB 14.7 04/08/2022   HCT 44.0 04/08/2022   MCV 94 04/08/2022   PLT 259 04/08/2022   No results found for: "IRON", "TIBC", "FERRITIN"  Attestation Statements:   Reviewed by clinician on day of visit: allergies, medications, problem list, medical history, surgical history, family history, social history, and previous encounter notes.  I have personally spent 47 minutes total time today in preparation, patient care, and documentation for this visit, including the following: review of clinical lab tests; review of medical tests/procedures/services.  I, Burt Knack, am acting as transcriptionist for Quillian Quince, MD.  I have reviewed the above documentation for accuracy and completeness, and I agree with the above. -  Quillian Quince, MD

## 2022-05-25 ENCOUNTER — Encounter (INDEPENDENT_AMBULATORY_CARE_PROVIDER_SITE_OTHER): Payer: Self-pay | Admitting: Family Medicine

## 2022-05-25 ENCOUNTER — Ambulatory Visit (INDEPENDENT_AMBULATORY_CARE_PROVIDER_SITE_OTHER): Payer: BC Managed Care – PPO | Admitting: Family Medicine

## 2022-06-09 ENCOUNTER — Ambulatory Visit (INDEPENDENT_AMBULATORY_CARE_PROVIDER_SITE_OTHER): Payer: BC Managed Care – PPO | Admitting: Family Medicine

## 2022-06-09 ENCOUNTER — Encounter (INDEPENDENT_AMBULATORY_CARE_PROVIDER_SITE_OTHER): Payer: Self-pay | Admitting: Family Medicine

## 2022-06-09 VITALS — BP 130/72 | HR 81 | Temp 98.0°F | Ht 67.0 in | Wt 201.0 lb

## 2022-06-09 DIAGNOSIS — Z6831 Body mass index (BMI) 31.0-31.9, adult: Secondary | ICD-10-CM

## 2022-06-09 DIAGNOSIS — E669 Obesity, unspecified: Secondary | ICD-10-CM | POA: Diagnosis not present

## 2022-06-09 DIAGNOSIS — E559 Vitamin D deficiency, unspecified: Secondary | ICD-10-CM | POA: Diagnosis not present

## 2022-06-09 DIAGNOSIS — R7303 Prediabetes: Secondary | ICD-10-CM | POA: Diagnosis not present

## 2022-06-09 MED ORDER — VITAMIN D (ERGOCALCIFEROL) 1.25 MG (50000 UNIT) PO CAPS: 50000.0000 [IU] | ORAL_CAPSULE | ORAL | 0 refills | Status: DC

## 2022-06-12 ENCOUNTER — Ambulatory Visit: Payer: BC Managed Care – PPO | Admitting: Internal Medicine

## 2022-06-22 NOTE — Progress Notes (Unsigned)
Chief Complaint:   OBESITY Stephanie Lynch is here to discuss her progress with her obesity treatment plan along with follow-up of her obesity related diagnoses. Stephanie Lynch is on the Category 2 Plan and states she is following her eating plan approximately 50% of the time. Stephanie Lynch states she is doing 0 minutes 0 times per week.  Today's visit was #: 4 Starting weight: 204 lbs Starting date: 04/08/2022 Today's weight: 201 lbs Today's date: 06/09/2022 Total lbs lost to date: 3 Total lbs lost since last in-office visit: 0  Interim History: Stephanie Lynch has done well with avoiding holiday weight gain over Thanksgiving. She is retaining some water weight today.   Subjective:   1. Prediabetes [place] didn't start metformin due to being sick and on an antibiotic.   2. Vitamin D deficiency Stephanie Lynch is on Vitamin D, and her level is not yet at goal but no side effects were noted.   Assessment/Plan:   1. Prediabetes Stephanie Lynch will start metformin next week when she is off her antibiotic.   2. Vitamin D deficiency We will refill prescription Vitamin D for 1 month. We will recheck labs in 6 weeks. Stephanie Lynch will follow-up for routine testing of Vitamin D, at least 2-3 times per year to avoid over-replacement.  - Vitamin D, Ergocalciferol, (DRISDOL) 1.25 MG (50000 UNIT) CAPS capsule; Take 1 capsule (50,000 Units total) by mouth every 7 (seven) days.  Dispense: 5 capsule; Refill: 0  3. Obesity, Current BMI 31.6 Stephanie Lynch is currently in the action stage of change. As such, her goal is to continue with weight loss efforts. She has agreed to the Category 3 Plan.   Behavioral modification strategies: increasing lean protein intake and holiday eating strategies .  Stephanie Lynch has agreed to follow-up with our clinic in 4 weeks. She was informed of the importance of frequent follow-up visits to maximize her success with intensive lifestyle modifications for her multiple health conditions.   Objective:   Blood  pressure 130/72, pulse 81, temperature 98 F (36.7 C), height 5\' 7"  (1.702 m), weight 201 lb (91.2 kg), SpO2 99 %. Body mass index is 31.48 kg/m.  General: Cooperative, alert, well developed, in no acute distress. HEENT: Conjunctivae and lids unremarkable. Cardiovascular: Regular rhythm.  Lungs: Normal work of breathing. Neurologic: No focal deficits.   Lab Results  Component Value Date   CREATININE 0.82 04/08/2022   BUN 12 04/08/2022   NA 143 04/08/2022   K 4.5 04/08/2022   CL 103 04/08/2022   CO2 24 04/08/2022   Lab Results  Component Value Date   ALT 11 04/08/2022   AST 17 04/08/2022   ALKPHOS 105 04/08/2022   BILITOT 0.4 04/08/2022   Lab Results  Component Value Date   HGBA1C 5.8 (H) 04/08/2022   HGBA1C 6.1 12/23/2021   HGBA1C 5.8 08/22/2021   Lab Results  Component Value Date   INSULIN 16.9 04/08/2022   Lab Results  Component Value Date   TSH 3.340 04/08/2022   Lab Results  Component Value Date   CHOL 202 (H) 04/08/2022   HDL 86 04/08/2022   LDLCALC 103 (H) 04/08/2022   LDLDIRECT 137.8 09/18/2010   TRIG 75 04/08/2022   CHOLHDL 3 08/22/2021   Lab Results  Component Value Date   VD25OH 38.3 04/08/2022   VD25OH 89.70 12/23/2021   VD25OH 48 06/19/2021   Lab Results  Component Value Date   WBC 3.8 04/08/2022   HGB 14.7 04/08/2022   HCT 44.0 04/08/2022   MCV 94 04/08/2022  PLT 259 04/08/2022   No results found for: "IRON", "TIBC", "FERRITIN"  Attestation Statements:   Reviewed by clinician on day of visit: allergies, medications, problem list, medical history, surgical history, family history, social history, and previous encounter notes.   I, Burt Knack, am acting as transcriptionist for Quillian Quince, MD.  I have reviewed the above documentation for accuracy and completeness, and I agree with the above. -  Quillian Quince, MD

## 2022-06-23 ENCOUNTER — Ambulatory Visit: Payer: BC Managed Care – PPO | Admitting: Internal Medicine

## 2022-07-09 ENCOUNTER — Encounter (INDEPENDENT_AMBULATORY_CARE_PROVIDER_SITE_OTHER): Payer: Self-pay | Admitting: Family Medicine

## 2022-07-09 ENCOUNTER — Other Ambulatory Visit (INDEPENDENT_AMBULATORY_CARE_PROVIDER_SITE_OTHER): Payer: Self-pay | Admitting: Family Medicine

## 2022-07-09 ENCOUNTER — Ambulatory Visit (INDEPENDENT_AMBULATORY_CARE_PROVIDER_SITE_OTHER): Payer: BC Managed Care – PPO | Admitting: Family Medicine

## 2022-07-09 VITALS — BP 130/81 | HR 74 | Temp 98.1°F | Ht 67.0 in | Wt 204.0 lb

## 2022-07-09 DIAGNOSIS — E559 Vitamin D deficiency, unspecified: Secondary | ICD-10-CM

## 2022-07-09 DIAGNOSIS — Z6832 Body mass index (BMI) 32.0-32.9, adult: Secondary | ICD-10-CM | POA: Diagnosis not present

## 2022-07-09 DIAGNOSIS — R7303 Prediabetes: Secondary | ICD-10-CM | POA: Diagnosis not present

## 2022-07-09 DIAGNOSIS — E669 Obesity, unspecified: Secondary | ICD-10-CM | POA: Diagnosis not present

## 2022-07-09 MED ORDER — VITAMIN D (ERGOCALCIFEROL) 1.25 MG (50000 UNIT) PO CAPS: 50000.0000 [IU] | ORAL_CAPSULE | ORAL | 0 refills | Status: DC

## 2022-07-09 MED ORDER — METFORMIN HCL 500 MG PO TABS: 500.0000 mg | ORAL_TABLET | Freq: Every day | ORAL | 0 refills | Status: DC

## 2022-07-21 NOTE — Progress Notes (Signed)
Chief Complaint:   OBESITY Stephanie Lynch is here to discuss her progress with her obesity treatment plan along with follow-up of her obesity related diagnoses. Stephanie Lynch is on the Category 3 Plan and states she is following her eating plan approximately 25% of the time. Stephanie Lynch states she is on the treadmill 2 times per week.  Today's visit was #: 5 Starting weight: 204 lbs Starting date: 04/08/2022 Today's weight: 204 lbs Today's date: 07/09/2022 Total lbs lost to date: 0 Total lbs lost since last in-office visit: 0  Interim History: Stephanie Lynch tried to minimize holiday weight gain, but she noted struggling when she was out of her normal routine.  She also noted some boredom eating.  Subjective:   1. Prediabetes Stephanie Lynch's recent A1c was elevated at 5.8, but this improved from 6.1.  She denies nausea, vomiting, or hypoglycemia.  2. Vitamin D deficiency Stephanie Lynch's last vitamin D level was not yet at goal.  She denies nausea, vomiting, or muscle weakness.  Assessment/Plan:   1. Prediabetes Stephanie Lynch will continue metformin, and we will refill for 1 month.  We will recheck labs in 1 to 2 months.  - metFORMIN (GLUCOPHAGE) 500 MG tablet; Take 1 tablet (500 mg total) by mouth daily.  Dispense: 30 tablet; Refill: 0  2. Vitamin D deficiency Stephanie Lynch will continue prescription vitamin D, and we will refill for 1 month.  We will recheck labs in 1 to 2 months.  - Vitamin D, Ergocalciferol, (DRISDOL) 1.25 MG (50000 UNIT) CAPS capsule; Take 1 capsule (50,000 Units total) by mouth every 7 (seven) days.  Dispense: 5 capsule; Refill: 0  3. Obesity, Current BMI 32.0 Stephanie Lynch is currently in the action stage of change. As such, her goal is to continue with weight loss efforts. She has agreed to the Category 2 Plan or the [place].   Exercise goals: As is.   Behavioral modification strategies: increasing lean protein intake.  Stephanie Lynch has agreed to follow-up with our clinic in 4 weeks. She was  informed of the importance of frequent follow-up visits to maximize her success with intensive lifestyle modifications for her multiple health conditions.   Objective:   Blood pressure 130/81, pulse 74, temperature 98.1 F (36.7 C), height 5\' 7"  (1.702 m), weight 204 lb (92.5 kg), SpO2 99 %. Body mass index is 31.95 kg/m.  General: Cooperative, alert, well developed, in no acute distress. HEENT: Conjunctivae and lids unremarkable. Cardiovascular: Regular rhythm.  Lungs: Normal work of breathing. Neurologic: No focal deficits.   Lab Results  Component Value Date   CREATININE 0.82 04/08/2022   BUN 12 04/08/2022   NA 143 04/08/2022   K 4.5 04/08/2022   CL 103 04/08/2022   CO2 24 04/08/2022   Lab Results  Component Value Date   ALT 11 04/08/2022   AST 17 04/08/2022   ALKPHOS 105 04/08/2022   BILITOT 0.4 04/08/2022   Lab Results  Component Value Date   HGBA1C 5.8 (H) 04/08/2022   HGBA1C 6.1 12/23/2021   HGBA1C 5.8 08/22/2021   Lab Results  Component Value Date   INSULIN 16.9 04/08/2022   Lab Results  Component Value Date   TSH 3.340 04/08/2022   Lab Results  Component Value Date   CHOL 202 (H) 04/08/2022   HDL 86 04/08/2022   LDLCALC 103 (H) 04/08/2022   LDLDIRECT 137.8 09/18/2010   TRIG 75 04/08/2022   CHOLHDL 3 08/22/2021   Lab Results  Component Value Date   VD25OH 38.3 04/08/2022   VD25OH  89.70 12/23/2021   VD25OH 48 06/19/2021   Lab Results  Component Value Date   WBC 3.8 04/08/2022   HGB 14.7 04/08/2022   HCT 44.0 04/08/2022   MCV 94 04/08/2022   PLT 259 04/08/2022   No results found for: "IRON", "TIBC", "FERRITIN"  Attestation Statements:   Reviewed by clinician on day of visit: allergies, medications, problem list, medical history, surgical history, family history, social history, and previous encounter notes.   I, Trixie Dredge, am acting as transcriptionist for Dennard Nip, MD.  I have reviewed the above documentation for accuracy  and completeness, and I agree with the above. -  Dennard Nip, MD

## 2022-07-27 ENCOUNTER — Encounter: Payer: Self-pay | Admitting: Family Medicine

## 2022-07-27 ENCOUNTER — Ambulatory Visit: Payer: BC Managed Care – PPO | Admitting: Family Medicine

## 2022-07-27 VITALS — BP 112/58 | HR 78 | Ht 67.5 in | Wt 207.0 lb

## 2022-07-27 DIAGNOSIS — E039 Hypothyroidism, unspecified: Secondary | ICD-10-CM

## 2022-07-27 DIAGNOSIS — E669 Obesity, unspecified: Secondary | ICD-10-CM

## 2022-07-27 NOTE — Patient Instructions (Signed)
Schedule your complete physical in 6 months No need to repeat labs today (and if Dr Leafy Ro does them, I'll see those) Continue to work on healthy diet and regular exercise- you can do it!! START the Metformin nightly w/ dinner Call with any questions or concerns Stay Safe!  Stay Healthy! So good to see you!!!

## 2022-07-27 NOTE — Assessment & Plan Note (Signed)
Ongoing issue for pt.  Currently seeing Dr Leafy Ro.  She was prescribed Metformin nearly 4 months ago but has not yet started it.  She wasn't sure when to take it b/c she doesn't eat breakfast regularly.  Encouraged her to take it at dinner daily.  Pt agreeable to start medication.  Will follow.

## 2022-07-27 NOTE — Assessment & Plan Note (Signed)
Chronic problem.  Currently on Synthroid 18mcg daily.  Labs were WNL in October- will not repeat today.  Pt is asymptomatic.  Will assume responsibility as pt is no longer seeing Dr Kelton Pillar.

## 2022-07-27 NOTE — Progress Notes (Signed)
   Subjective:    Patient ID: Stephanie Lynch, female    DOB: 1971-03-21, 52 y.o.   MRN: [id number]  HPI Pt to re-establish.  Has not been seen by me in 6+ yrs  Obesity- ongoing issue for pt.  Now seeing Dr Leafy Ro.  She prescribed Metformin but pt has not yet started.  Wants to discuss.  Pt isn't sure what time to take medication.  She doesn't eat breakfast regularly.  Does eat dinner regularly.  No CP, SOB, HA's, abd pain, N/V.  Hypothyroid- currently on Synthroid 13mcg daily.  Labs done in October were WNL.  Pt denies changes to energy level, skin/hair/nails   Review of Systems For ROS see HPI     Objective:   Physical Exam Vitals reviewed.  Constitutional:      General: She is not in acute distress.    Appearance: Normal appearance. She is well-developed. She is obese. She is not ill-appearing.  HENT:     Head: Normocephalic and atraumatic.  Eyes:     Conjunctiva/sclera: Conjunctivae normal.     Pupils: Pupils are equal, round, and reactive to light.  Neck:     Thyroid: No thyromegaly.  Cardiovascular:     Rate and Rhythm: Normal rate and regular rhythm.     Pulses: Normal pulses.     Heart sounds: Normal heart sounds. No murmur heard. Pulmonary:     Effort: Pulmonary effort is normal. No respiratory distress.     Breath sounds: Normal breath sounds.  Abdominal:     General: There is no distension.     Palpations: Abdomen is soft.     Tenderness: There is no abdominal tenderness.  Musculoskeletal:     Cervical back: Normal range of motion and neck supple.     Right lower leg: No edema.     Left lower leg: No edema.  Lymphadenopathy:     Cervical: No cervical adenopathy.  Skin:    General: Skin is warm and dry.  Neurological:     General: No focal deficit present.     Mental Status: She is alert and oriented to person, place, and time.  Psychiatric:        Mood and Affect: Mood normal.        Behavior: Behavior normal.        Thought Content: Thought content  normal.           Assessment & Plan:

## 2022-07-28 ENCOUNTER — Encounter: Payer: Self-pay | Admitting: Family Medicine

## 2022-07-28 ENCOUNTER — Other Ambulatory Visit (INDEPENDENT_AMBULATORY_CARE_PROVIDER_SITE_OTHER): Payer: Self-pay | Admitting: Family Medicine

## 2022-07-28 ENCOUNTER — Ambulatory Visit (INDEPENDENT_AMBULATORY_CARE_PROVIDER_SITE_OTHER): Payer: BC Managed Care – PPO | Admitting: Family Medicine

## 2022-07-28 ENCOUNTER — Encounter (INDEPENDENT_AMBULATORY_CARE_PROVIDER_SITE_OTHER): Payer: Self-pay | Admitting: Family Medicine

## 2022-07-28 VITALS — BP 133/84 | HR 81 | Temp 97.8°F | Ht 67.0 in | Wt 202.0 lb

## 2022-07-28 DIAGNOSIS — E669 Obesity, unspecified: Secondary | ICD-10-CM

## 2022-07-28 DIAGNOSIS — E88819 Insulin resistance, unspecified: Secondary | ICD-10-CM | POA: Diagnosis not present

## 2022-07-28 DIAGNOSIS — R7303 Prediabetes: Secondary | ICD-10-CM

## 2022-07-28 DIAGNOSIS — Z6831 Body mass index (BMI) 31.0-31.9, adult: Secondary | ICD-10-CM | POA: Diagnosis not present

## 2022-07-28 MED ORDER — METFORMIN HCL 500 MG PO TABS: 500.0000 mg | ORAL_TABLET | Freq: Every day | ORAL | 0 refills | Status: DC

## 2022-08-01 ENCOUNTER — Encounter (INDEPENDENT_AMBULATORY_CARE_PROVIDER_SITE_OTHER): Payer: Self-pay | Admitting: Family Medicine

## 2022-08-12 NOTE — Progress Notes (Signed)
Chief Complaint:   OBESITY Stephanie Lynch is here to discuss her progress with her obesity treatment plan along with follow-up of her obesity related diagnoses. Stephanie Lynch is on the Category 2 Plan or the [employer] and states she is following her eating plan approximately 60% of the time. Stephanie Lynch states she is doing 0 minutes 0 times per week.  Today's visit was #: 6 Starting weight: 204 lbs Starting date: 04/08/2022 Today's weight: 202 lbs Today's date: 07/28/2022 Total lbs lost to date: 2 Total lbs lost since last in-office visit: 2  Interim History: Stephanie Lynch has done better with weight loss. She notes she is struggling to meet her protein goals and she feels she needs something sweet after meals. She is working on finding some healthier options to help her feel more satisfied.   Subjective:   1. Insulin resistance Stephanie Lynch is working on her diet and weight loss. She requests a refill of metformin. She is working on improving stress to help reduce cortisol.   Assessment/Plan:   1. Insulin resistance We will refill metformin 500 mg daily for 1 month. Stephanie Lynch was educated on diet.   2. BMI 31.0-31.9,adult  3. Obesity, Beginning BMI 31.95 Stephanie Lynch is currently in the action stage of change. As such, her goal is to continue with weight loss efforts. She has agreed to change to the [employer].   Behavioral modification strategies: increasing lean protein intake, no skipping meals, and emotional eating strategies.  Stephanie Lynch has agreed to follow-up with our clinic in 3 to 4 weeks. She was informed of the importance of frequent follow-up visits to maximize her success with intensive lifestyle modifications for her multiple health conditions.   Objective:   Blood pressure 133/84, pulse 81, temperature 97.8 F (36.6 C), height 5\' 7"  (1.702 m), weight 202 lb (91.6 kg), SpO2 98 %. Body mass index is 31.64 kg/m.  General: Cooperative, alert, well developed, in no acute  distress. HEENT: Conjunctivae and lids unremarkable. Cardiovascular: Regular rhythm.  Lungs: Normal work of breathing. Neurologic: No focal deficits.   Lab Results  Component Value Date   CREATININE 0.82 04/08/2022   BUN 12 04/08/2022   NA 143 04/08/2022   K 4.5 04/08/2022   CL 103 04/08/2022   CO2 24 04/08/2022   Lab Results  Component Value Date   ALT 11 04/08/2022   AST 17 04/08/2022   ALKPHOS 105 04/08/2022   BILITOT 0.4 04/08/2022   Lab Results  Component Value Date   HGBA1C 5.8 (H) 04/08/2022   HGBA1C 6.1 12/23/2021   HGBA1C 5.8 08/22/2021   Lab Results  Component Value Date   INSULIN 16.9 04/08/2022   Lab Results  Component Value Date   TSH 3.340 04/08/2022   Lab Results  Component Value Date   CHOL 202 (H) 04/08/2022   HDL 86 04/08/2022   LDLCALC 103 (H) 04/08/2022   LDLDIRECT 137.8 09/18/2010   TRIG 75 04/08/2022   CHOLHDL 3 08/22/2021   Lab Results  Component Value Date   VD25OH 38.3 04/08/2022   VD25OH 89.70 12/23/2021   VD25OH 48 06/19/2021   Lab Results  Component Value Date   WBC 3.8 04/08/2022   HGB 14.7 04/08/2022   HCT 44.0 04/08/2022   MCV 94 04/08/2022   PLT 259 04/08/2022   No results found for: "IRON", "TIBC", "FERRITIN"  Attestation Statements:   Reviewed by clinician on day of visit: allergies, medications, problem list, medical history, surgical history, family history, social history, and previous encounter  notes.  Time spent on visit including pre-visit chart review and post-visit care and charting was 30 minutes.   I, Trixie Dredge, am acting as transcriptionist for Dennard Nip, MD.  I have reviewed the above documentation for accuracy and completeness, and I agree with the above. -  Dennard Nip, MD

## 2022-08-25 ENCOUNTER — Ambulatory Visit (INDEPENDENT_AMBULATORY_CARE_PROVIDER_SITE_OTHER): Payer: BC Managed Care – PPO | Admitting: Family Medicine

## 2022-08-25 ENCOUNTER — Encounter (INDEPENDENT_AMBULATORY_CARE_PROVIDER_SITE_OTHER): Payer: Self-pay | Admitting: Family Medicine

## 2022-08-25 VITALS — BP 130/79 | HR 77 | Temp 97.5°F | Ht 67.0 in | Wt 202.0 lb

## 2022-08-25 DIAGNOSIS — E669 Obesity, unspecified: Secondary | ICD-10-CM

## 2022-08-25 DIAGNOSIS — Z6831 Body mass index (BMI) 31.0-31.9, adult: Secondary | ICD-10-CM | POA: Diagnosis not present

## 2022-08-25 DIAGNOSIS — E559 Vitamin D deficiency, unspecified: Secondary | ICD-10-CM

## 2022-08-25 DIAGNOSIS — R7303 Prediabetes: Secondary | ICD-10-CM | POA: Diagnosis not present

## 2022-09-07 NOTE — Progress Notes (Unsigned)
Chief Complaint:   OBESITY Stephanie Lynch is here to discuss her progress with her obesity treatment plan along with follow-up of her obesity related diagnoses. Stephanie Lynch is on the [employer] and states she is following her eating plan approximately 60% of the time. Stephanie Lynch states she is doing 0 minutes 0 times per week.  Today's visit was #: 7 Starting weight: 204 lbs Starting date: 04/08/2022 Today's weight: 202 lbs Today's date: 08/25/2022 Total lbs lost to date: 2 Total lbs lost since last in-office visit: 0  Interim History: Stephanie Lynch has done well with maintaining her weight.  She has been very busy caring for multiple sick family members, and she is open to discussing easy meals strategies.  Subjective:   1. Pre-diabetes Stephanie Lynch is doing well on metformin with no GI upset.  She is working on decreasing simple carbohydrates in her diet.  2. Vitamin D deficiency Stephanie Lynch is on OTC vitamin D, and she is doing better with not missing doses.  Assessment/Plan:   1. Pre-diabetes Stephanie Lynch will continue metformin at 500 mg once daily, and we will recheck labs in 1 month.  2. Vitamin D deficiency Stephanie Lynch will continue OTC vitamin D, and we will recheck labs in 1 month.  3. BMI 31.0-31.9,adult  4. Obesity, Beginning BMI 31.95 Stephanie Lynch is currently in the action stage of change. As such, her goal is to continue with weight loss efforts. She has agreed to keeping a food journal and adhering to recommended goals of 500 calories and 35 grams of protein at lunch or supper and the [employer].   We will recheck fasting labs at her next visit.   Behavioral modification strategies: increasing lean protein intake and no skipping meals.  Stephanie Lynch has agreed to follow-up with our clinic in 4 weeks. She was informed of the importance of frequent follow-up visits to maximize her success with intensive lifestyle modifications for her multiple health conditions.   Objective:   Blood  pressure 130/79, pulse 77, temperature (!) 97.5 F (36.4 C), height '5\' 7"'$  (1.702 m), weight 202 lb (91.6 kg), SpO2 98 %. Body mass index is 31.64 kg/m.  Lab Results  Component Value Date   CREATININE 0.82 04/08/2022   BUN 12 04/08/2022   NA 143 04/08/2022   K 4.5 04/08/2022   CL 103 04/08/2022   CO2 24 04/08/2022   Lab Results  Component Value Date   ALT 11 04/08/2022   AST 17 04/08/2022   ALKPHOS 105 04/08/2022   BILITOT 0.4 04/08/2022   Lab Results  Component Value Date   HGBA1C 5.8 (H) 04/08/2022   HGBA1C 6.1 12/23/2021   HGBA1C 5.8 08/22/2021   Lab Results  Component Value Date   INSULIN 16.9 04/08/2022   Lab Results  Component Value Date   TSH 3.340 04/08/2022   Lab Results  Component Value Date   CHOL 202 (H) 04/08/2022   HDL 86 04/08/2022   LDLCALC 103 (H) 04/08/2022   LDLDIRECT 137.8 09/18/2010   TRIG 75 04/08/2022   CHOLHDL 3 08/22/2021   Lab Results  Component Value Date   VD25OH 38.3 04/08/2022   VD25OH 89.70 12/23/2021   VD25OH 48 06/19/2021   Lab Results  Component Value Date   WBC 3.8 04/08/2022   HGB 14.7 04/08/2022   HCT 44.0 04/08/2022   MCV 94 04/08/2022   PLT 259 04/08/2022   No results found for: "IRON", "TIBC", "FERRITIN"  Attestation Statements:   Reviewed by clinician on day of visit: allergies,  medications, problem list, medical history, surgical history, family history, social history, and previous encounter notes.   I, Trixie Dredge, am acting as transcriptionist for Dennard Nip, MD.  I have reviewed the above documentation for accuracy and completeness, and I agree with the above. -  Dennard Nip, MD

## 2022-09-15 ENCOUNTER — Ambulatory Visit (INDEPENDENT_AMBULATORY_CARE_PROVIDER_SITE_OTHER): Payer: BC Managed Care – PPO | Admitting: Family Medicine

## 2022-10-13 ENCOUNTER — Ambulatory Visit (INDEPENDENT_AMBULATORY_CARE_PROVIDER_SITE_OTHER): Payer: BC Managed Care – PPO | Admitting: Family Medicine

## 2022-10-13 ENCOUNTER — Encounter (INDEPENDENT_AMBULATORY_CARE_PROVIDER_SITE_OTHER): Payer: Self-pay | Admitting: Family Medicine

## 2022-10-13 VITALS — BP 117/73 | HR 68 | Temp 98.2°F | Ht 67.0 in | Wt 199.0 lb

## 2022-10-13 DIAGNOSIS — E7800 Pure hypercholesterolemia, unspecified: Secondary | ICD-10-CM | POA: Diagnosis not present

## 2022-10-13 DIAGNOSIS — E559 Vitamin D deficiency, unspecified: Secondary | ICD-10-CM

## 2022-10-13 DIAGNOSIS — Z6831 Body mass index (BMI) 31.0-31.9, adult: Secondary | ICD-10-CM

## 2022-10-13 DIAGNOSIS — E669 Obesity, unspecified: Secondary | ICD-10-CM

## 2022-10-13 DIAGNOSIS — R7303 Prediabetes: Secondary | ICD-10-CM | POA: Diagnosis not present

## 2022-10-13 DIAGNOSIS — E039 Hypothyroidism, unspecified: Secondary | ICD-10-CM | POA: Diagnosis not present

## 2022-10-13 DIAGNOSIS — K5904 Chronic idiopathic constipation: Secondary | ICD-10-CM | POA: Diagnosis not present

## 2022-10-13 MED ORDER — METFORMIN HCL 500 MG PO TABS: 500.0000 mg | ORAL_TABLET | Freq: Two times a day (BID) | ORAL | 0 refills | Status: DC

## 2022-10-13 NOTE — Progress Notes (Unsigned)
Chief Complaint:   OBESITY Stephanie Lynch is here to discuss her progress with her obesity treatment plan along with follow-up of her obesity related diagnoses. Stephanie Lynch is on the [employer] and keeping a food journal and adhering to recommended goals of 500 calories and 35 grams of protein at lunch or supper and states she is following her eating plan approximately 75-80% of the time. Stephanie Lynch states she is walking for 30 minutes 2 times per week.  Today's visit was #: 8 Starting weight: 204 lbs Starting date: 04/08/2022 Today's weight: 199 lbs Today's date: 10/13/2022 Total lbs lost to date: 5 Total lbs lost since last in-office visit: 3  Interim History: Stephanie Lynch continues to work on portion control and [employer].  She has not been journaling as much due to her an increased schedule with caring for her elderly parents.  She is working on increasing her protein and decreasing simple carbohydrates.  Subjective:   1. Chronic idiopathic constipation Stephanie Lynch notes as needed MiraLAX is not helping anymore, and this has been a long-term problem.  2. Prediabetes Stephanie Lynch continues to work on her diet, and she denies diarrhea.  She is willing to try to increase her medications to help with polyphagia.  3. Pure hypercholesterolemia Stephanie Lynch is not on a statin, and she is working on her diet.  She is due to have labs rechecked.  4. Vitamin D deficiency Stephanie Lynch is on vitamin D supplementation, and she is due for labs.  5. Acquired hypothyroidism Stephanie Lynch is on Synthroid, and her last levels were within normal limits.  No palpitation was noted.  Assessment/Plan:   1. Chronic idiopathic constipation Stephanie Lynch agreed to start OTC magnesium and Colace.  She will work on increasing her exercise and water intake.  2. Prediabetes We will check labs today.  Stephanie Lynch agreed to increase metformin to 500 mg twice daily, and we will refill for 1 month.  - metFORMIN (GLUCOPHAGE) 500 MG  tablet; Take 1 tablet (500 mg total) by mouth 2 (two) times daily with a meal.  Dispense: 60 tablet; Refill: 0 - Insulin, random - Hemoglobin A1c - CMP14+EGFR - Vitamin B12  3. Pure hypercholesterolemia We will check labs today.  Stephanie Lynch will continue to work on her diet, exercise, and weight loss.  - Lipid Panel With LDL/HDL Ratio  4. Vitamin D deficiency We will check labs today, and we will follow-up at Stephanie Lynch's next visit.  - VITAMIN D 25 Hydroxy (Vit-D Deficiency, Fractures)  5. Acquired hypothyroidism We will check labs today, and we will follow-up at Stephanie Lynch's next visit.  - TSH  6. BMI 31.0-31.9,adult  7. Obesity, Beginning BMI 31.95 Stephanie Lynch is currently in the action stage of change. As such, her goal is to continue with weight loss efforts. She has agreed to practicing portion control and making smarter food choices, such as increasing vegetables and decreasing simple carbohydrates.   Exercise goals: As is.   Behavioral modification strategies: increasing lean protein intake and meal planning and cooking strategies.  Stephanie Lynch has agreed to follow-up with our clinic in 4 weeks. She was informed of the importance of frequent follow-up visits to maximize her success with intensive lifestyle modifications for her multiple health conditions.   Stephanie Lynch was informed we would discuss her lab results at her next visit unless there is a critical issue that needs to be addressed sooner. Stephanie Lynch agreed to keep her next visit at the agreed upon time to discuss these results.  Objective:   Blood pressure  117/73, pulse 68, temperature 98.2 F (36.8 C), height 5\' 7"  (1.702 m), weight 199 lb (90.3 kg), SpO2 99 %. Body mass index is 31.17 kg/m.  Lab Results  Component Value Date   CREATININE 0.82 04/08/2022   BUN 12 04/08/2022   NA 143 04/08/2022   K 4.5 04/08/2022   CL 103 04/08/2022   CO2 24 04/08/2022   Lab Results  Component Value Date   ALT 11 04/08/2022   AST 17  04/08/2022   ALKPHOS 105 04/08/2022   BILITOT 0.4 04/08/2022   Lab Results  Component Value Date   HGBA1C 5.8 (H) 04/08/2022   HGBA1C 6.1 12/23/2021   HGBA1C 5.8 08/22/2021   Lab Results  Component Value Date   INSULIN 16.9 04/08/2022   Lab Results  Component Value Date   TSH 3.340 04/08/2022   Lab Results  Component Value Date   CHOL 202 (H) 04/08/2022   HDL 86 04/08/2022   LDLCALC 103 (H) 04/08/2022   LDLDIRECT 137.8 09/18/2010   TRIG 75 04/08/2022   CHOLHDL 3 08/22/2021   Lab Results  Component Value Date   VD25OH 38.3 04/08/2022   VD25OH 89.70 12/23/2021   VD25OH 48 06/19/2021   Lab Results  Component Value Date   WBC 3.8 04/08/2022   HGB 14.7 04/08/2022   HCT 44.0 04/08/2022   MCV 94 04/08/2022   PLT 259 04/08/2022   No results found for: "IRON", "TIBC", "FERRITIN"  Attestation Statements:   Reviewed by clinician on day of visit: allergies, medications, problem list, medical history, surgical history, family history, social history, and previous encounter notes.   I, Burt Knack, am acting as transcriptionist for Quillian Quince, MD.  I have reviewed the above documentation for accuracy and completeness, and I agree with the above. -  Quillian Quince, MD

## 2022-10-14 LAB — CMP14+EGFR
ALT: 16 IU/L (ref 0–32)
AST: 17 IU/L (ref 0–40)
Albumin/Globulin Ratio: 1.6 (ref 1.2–2.2)
Albumin: 4.4 g/dL (ref 3.9–4.9)
Alkaline Phosphatase: 115 IU/L (ref 44–121)
BUN/Creatinine Ratio: 18 (ref 9–23)
BUN: 14 mg/dL (ref 6–24)
Bilirubin Total: 0.3 mg/dL (ref 0.0–1.2)
CO2: 25 mmol/L (ref 20–29)
Calcium: 9.7 mg/dL (ref 8.7–10.2)
Chloride: 102 mmol/L (ref 96–106)
Creatinine, Ser: 0.78 mg/dL (ref 0.57–1.00)
Globulin, Total: 2.7 g/dL (ref 1.5–4.5)
Glucose: 93 mg/dL (ref 70–99)
Potassium: 4.5 mmol/L (ref 3.5–5.2)
Sodium: 142 mmol/L (ref 134–144)
Total Protein: 7.1 g/dL (ref 6.0–8.5)
eGFR: 92 mL/min/{1.73_m2} (ref >59)

## 2022-10-14 LAB — VITAMIN B12: Vitamin B-12: 494 pg/mL (ref 232–1245)

## 2022-10-14 LAB — LIPID PANEL WITH LDL/HDL RATIO
Cholesterol, Total: 181 mg/dL (ref 100–199)
HDL: 76 mg/dL (ref >39)
LDL Chol Calc (NIH): 93 mg/dL (ref 0–99)
LDL/HDL Ratio: 1.2 ratio (ref 0.0–3.2)
Triglycerides: 61 mg/dL (ref 0–149)
VLDL Cholesterol Cal: 12 mg/dL (ref 5–40)

## 2022-10-14 LAB — INSULIN, RANDOM: INSULIN: 15 u[IU]/mL (ref 2.6–24.9)

## 2022-10-14 LAB — HEMOGLOBIN A1C
Est. average glucose Bld gHb Est-mCnc: 126 mg/dL
Hgb A1c MFr Bld: 6 % — ABNORMAL HIGH (ref 4.8–5.6)

## 2022-10-14 LAB — TSH: TSH: 1.06 u[IU]/mL (ref 0.450–4.500)

## 2022-10-14 LAB — VITAMIN D 25 HYDROXY (VIT D DEFICIENCY, FRACTURES): Vit D, 25-Hydroxy: 49.4 ng/mL (ref 30.0–100.0)

## 2022-10-15 ENCOUNTER — Other Ambulatory Visit (INDEPENDENT_AMBULATORY_CARE_PROVIDER_SITE_OTHER): Payer: Self-pay | Admitting: Family Medicine

## 2022-10-15 DIAGNOSIS — R7303 Prediabetes: Secondary | ICD-10-CM

## 2022-10-27 ENCOUNTER — Encounter (INDEPENDENT_AMBULATORY_CARE_PROVIDER_SITE_OTHER): Payer: BC Managed Care – PPO | Admitting: Family Medicine

## 2022-10-27 DIAGNOSIS — L719 Rosacea, unspecified: Secondary | ICD-10-CM

## 2022-10-28 DIAGNOSIS — L719 Rosacea, unspecified: Secondary | ICD-10-CM | POA: Diagnosis not present

## 2022-10-28 MED ORDER — METRONIDAZOLE 0.75 % EX GEL: 1.0000 | Freq: Two times a day (BID) | CUTANEOUS | 1 refills | Status: AC

## 2022-10-28 NOTE — Telephone Encounter (Signed)
[facility] VISIT   Patient agreed to [facility] visit and is aware that copayment and coinsurance may apply. Patient was treated using telemedicine according to accepted telemedicine protocols.  Subjective:   Patient complains of worsening roseacea  Patient Active Problem List   Diagnosis Date Noted   Chronic idiopathic constipation 10/13/2022   Pre-diabetes 08/25/2022   BMI 31.0-31.9,adult 07/28/2022   Insulin resistance 05/06/2022   Stress 05/06/2022   Other constipation 04/21/2022   Other fatigue 04/08/2022   SOBOE (shortness of breath on exertion) 04/08/2022   Hyperlipidemia 04/08/2022   Depression screening 04/08/2022   Generalized obesity 04/08/2022   Prediabetes 06/17/2021   Low vitamin B12 level 06/17/2021   Hashimoto's disease 02/18/2021   Bilateral plantar fasciitis 09/26/2015   Vitamin D deficiency 08/08/2015   Myalgia and myositis 04/04/2015   Neck swelling 04/01/2015   Rosacea 01/12/2014   Anxiety and depression 05/18/2013   Eye pain 02/19/2012   Screening for malignant neoplasm of cervix 10/13/2011   Routine general medical examination at a health care facility 10/13/2011   KNEE PAIN 09/18/2010   Hypothyroidism 08/22/2006   Social History   Tobacco Use   Smoking status: Former    Years: 25    Types: Cigarettes    Quit date: 07/06/2005    Years since quitting: 17.3   Smokeless tobacco: Never  Substance Use Topics   Alcohol use: Not on file    Comment: social    Current Outpatient Medications:    cholecalciferol (VITAMIN D) 1000 UNITS tablet, Take 750 Units by mouth daily., Disp: , Rfl:    Cyanocobalamin (VITAMIN B-12) 2500 MCG SUBL, Place 1 tablet under the tongue daily., Disp: , Rfl:    levothyroxine (SYNTHROID) 88 MCG tablet, Take 1 tablet (88 mcg total) by mouth daily before breakfast., Disp: 90 tablet, Rfl: 3   metFORMIN (GLUCOPHAGE) 500 MG tablet, Take 1 tablet (500 mg total) by mouth 2 (two) times daily with a meal., Disp: 60 tablet, Rfl: 0    metroNIDAZOLE (METROGEL) 0.75 % gel, Apply 1 Application topically 2 (two) times daily., Disp: 45 g, Rfl: 1   Probiotic Product (ALIGN PO), Take by mouth., Disp: , Rfl:   Allergies  Allergen Reactions   Doxycycline Other (See Comments)    Upset stomach    Assessment and Plan:   Diagnosis: Roseacea. Please see myChart communication and orders below.   No orders of the defined types were placed in this encounter.  Meds ordered this encounter  Medications   metroNIDAZOLE (METROGEL) 0.75 % gel    Sig: Apply 1 Application topically 2 (two) times daily.    Dispense:  45 g    Refill:  1    Neena Rhymes, MD 10/28/2022  A total of 6 minutes were spent by me to personally review the patient-generated inquiry, review patient records and data pertinent to assessment of the patient's problem, develop a management plan including generation of prescriptions and/or orders, and on subsequent communication with the patient through secure the MyChart portal service.   There is no separately reported E/M service related to this service in the past 7 days nor does the patient have an upcoming soonest available appointment for this issue. This work was completed in less than 7 days.   The patient consented to this service today (see patient agreement prior to ongoing communication). Patient counseled regarding the need for in-person exam for certain conditions and was advised to call the office if any changing or worsening symptoms occur.   The  codes to be used for the E/M service are:   99421 for 5-10 minutes of time spent on the inquiry.   [id number] for 11-20 minutes.   99423 for 21+ minutes.

## 2022-11-11 DIAGNOSIS — J019 Acute sinusitis, unspecified: Secondary | ICD-10-CM | POA: Diagnosis not present

## 2022-11-11 DIAGNOSIS — J040 Acute laryngitis: Secondary | ICD-10-CM | POA: Diagnosis not present

## 2022-11-17 ENCOUNTER — Encounter (INDEPENDENT_AMBULATORY_CARE_PROVIDER_SITE_OTHER): Payer: Self-pay | Admitting: Family Medicine

## 2022-11-17 ENCOUNTER — Ambulatory Visit (INDEPENDENT_AMBULATORY_CARE_PROVIDER_SITE_OTHER): Payer: BC Managed Care – PPO | Admitting: Family Medicine

## 2022-11-17 VITALS — BP 122/79 | HR 66 | Temp 97.8°F | Ht 67.0 in | Wt 198.0 lb

## 2022-11-17 DIAGNOSIS — E669 Obesity, unspecified: Secondary | ICD-10-CM

## 2022-11-17 DIAGNOSIS — R7303 Prediabetes: Secondary | ICD-10-CM

## 2022-11-17 DIAGNOSIS — Z6831 Body mass index (BMI) 31.0-31.9, adult: Secondary | ICD-10-CM | POA: Diagnosis not present

## 2022-11-17 DIAGNOSIS — K5909 Other constipation: Secondary | ICD-10-CM

## 2022-11-18 NOTE — Progress Notes (Signed)
Chief Complaint:   OBESITY Stephanie Lynch is here to discuss her progress with her obesity treatment plan along with follow-up of her obesity related diagnoses. Stephanie Lynch is on practicing portion control and making smarter food choices, such as increasing vegetables and decreasing simple carbohydrates and states she is following her eating plan approximately 70% of the time. Stephanie Lynch states she is walking for 30-45 minutes 2-3 times per week.  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  She bases her food choices on the category 2 plan.  She feels her hunger is well-controlled.  Subjective:   1. Other constipation Stephanie Lynch has chronic constipation and often has a BM only 1 time per week.  She denies abdominal pain.  2. Prediabetes Stephanie Lynch's last A1c was elevated at 6.0.  She felt her metformin was not helping her so she stopped.  Assessment/Plan:   1. Other constipation We will refer Angelyn to GI for evaluation.  - Ambulatory referral to Gastroenterology  2. Prediabetes Stephanie Lynch agreed to discontinue metformin, and we will recheck labs in 2 months to monitor her progress.  3. BMI 31.0-31.9,adult  4. Obesity, Beginning BMI 31.95 Stephanie Lynch is currently in the action stage of change. As such, her goal is to continue with weight loss efforts. She has agreed to practicing portion control and making smarter food choices, such as increasing vegetables and decreasing simple carbohydrates.   Exercise goals: Chair yoga for 15 minutes 5 times per week.   Behavioral modification strategies: increasing lean protein intake.  Stephanie Lynch has agreed to follow-up with our clinic in 4 weeks. She was informed of the importance of frequent follow-up visits to maximize  her success with intensive lifestyle modifications for her multiple health conditions.   Objective:   Blood pressure 122/79, pulse 66, temperature 97.8 F (36.6 C), height 5\' 7"  (1.702 m), weight 198 lb (89.8 kg), SpO2 98 %. Body mass index is 31.01 kg/m.  Lab Results  Component Value Date   CREATININE 0.78 10/13/2022   BUN 14 10/13/2022   NA 142 10/13/2022   K 4.5 10/13/2022   CL 102 10/13/2022   CO2 25 10/13/2022   Lab Results  Component Value Date   ALT 16 10/13/2022   AST 17 10/13/2022   ALKPHOS 115 10/13/2022   BILITOT 0.3 10/13/2022   Lab Results  Component Value Date   HGBA1C 6.0 (H) 10/13/2022   HGBA1C 5.8 (H) 04/08/2022   HGBA1C 6.1 12/23/2021   HGBA1C 5.8 08/22/2021   Lab Results  Component Value Date   INSULIN 15.0 10/13/2022   INSULIN 16.9 04/08/2022   Lab Results  Component Value Date   TSH 1.060 10/13/2022   Lab Results  Component Value Date   CHOL 181 10/13/2022   HDL 76 10/13/2022   LDLCALC 93 10/13/2022   LDLDIRECT 137.8 09/18/2010   TRIG 61 10/13/2022   CHOLHDL 3 08/22/2021   Lab Results  Component Value Date   VD25OH 49.4 10/13/2022   VD25OH 38.3 04/08/2022   VD25OH 89.70 12/23/2021   Lab Results  Component Value Date   WBC 3.8 04/08/2022   HGB 14.7 04/08/2022   HCT 44.0 04/08/2022   MCV 94 04/08/2022   PLT 259 04/08/2022   No results found for: "IRON", "TIBC", "FERRITIN"  Attestation  Statements:   Reviewed by clinician on day of visit: allergies, medications, problem list, medical history, surgical history, family history, social history, and previous encounter notes.  I have personally spent 44 minutes total time today in preparation, patient care, and documentation for this visit, including the following: review of clinical lab tests; review of medical tests/procedures/services.  I, Burt Knack, am acting as transcriptionist for Quillian Quince, MD.  I have reviewed the above documentation for accuracy and completeness,  and I agree with the above. -  Quillian Quince, MD

## 2022-11-27 ENCOUNTER — Encounter (INDEPENDENT_AMBULATORY_CARE_PROVIDER_SITE_OTHER): Payer: Self-pay | Admitting: Family Medicine

## 2022-12-01 NOTE — Telephone Encounter (Signed)
Brendell, please follow up on this GI referral and update the patient

## 2022-12-17 ENCOUNTER — Encounter (INDEPENDENT_AMBULATORY_CARE_PROVIDER_SITE_OTHER): Payer: Self-pay | Admitting: Family Medicine

## 2022-12-17 ENCOUNTER — Other Ambulatory Visit (INDEPENDENT_AMBULATORY_CARE_PROVIDER_SITE_OTHER): Payer: Self-pay | Admitting: Family Medicine

## 2022-12-17 ENCOUNTER — Ambulatory Visit (INDEPENDENT_AMBULATORY_CARE_PROVIDER_SITE_OTHER): Payer: BC Managed Care – PPO | Admitting: Family Medicine

## 2022-12-17 VITALS — BP 132/78 | HR 75 | Temp 98.1°F | Ht 67.0 in | Wt 202.0 lb

## 2022-12-17 DIAGNOSIS — R7303 Prediabetes: Secondary | ICD-10-CM

## 2022-12-17 DIAGNOSIS — M25561 Pain in right knee: Secondary | ICD-10-CM | POA: Diagnosis not present

## 2022-12-17 DIAGNOSIS — M25562 Pain in left knee: Secondary | ICD-10-CM | POA: Diagnosis not present

## 2022-12-17 DIAGNOSIS — E669 Obesity, unspecified: Secondary | ICD-10-CM | POA: Diagnosis not present

## 2022-12-17 DIAGNOSIS — Z6831 Body mass index (BMI) 31.0-31.9, adult: Secondary | ICD-10-CM

## 2022-12-17 MED ORDER — METFORMIN HCL 500 MG PO TABS
500.0000 mg | ORAL_TABLET | Freq: Two times a day (BID) | ORAL | 0 refills | Status: DC
Start: 2022-12-17 — End: 2023-01-21

## 2022-12-21 NOTE — Progress Notes (Unsigned)
Chief Complaint:   OBESITY Stephanie Lynch is here to discuss her progress with her obesity treatment plan along with follow-up of her obesity related diagnoses. Stephanie Lynch is on practicing portion control and making smarter food choices, such as increasing vegetables and decreasing simple carbohydrates and states she is following her eating plan approximately 65-75% of the time. Stephanie Lynch states she is walking and playing pickle ball for 30-60 minutes 3-4 times per week.  Today's visit was #: 10 Starting weight: 204 lbs Starting date: 04/08/2022 Today's weight: 202 lbs Today's date: 12/17/2022 Total lbs lost to date: 2 Total lbs lost since last in-office visit: 0  Interim History: Patient has had more changes in her routine and she is working on figuring out how to meal plan.  She notes eating out more, but she has increased her exercise.  Subjective:   1. Acute pain of both knees Patient notes knee pain is worsening with walking.  She has a treadmill at home and the impact may be causing her pain.  2. Pre-diabetes Patient is struggling with weight loss.  She has tolerated metformin in the past but she was not sure it was helping, especially with low doses.  Assessment/Plan:   1. Acute pain of both knees We will refer the patient to Dr. Denyse Amass for evaluation.  She is to work on lower impact activity in the meanwhile.  - Ambulatory referral to Sports Medicine  2. Pre-diabetes Patient agreed to restart metformin 500 mg twice daily with no refills.  We will follow-up at her next visit in 1 month.  - metFORMIN (GLUCOPHAGE) 500 MG tablet; Take 1 tablet (500 mg total) by mouth 2 (two) times daily with a meal.  Dispense: 60 tablet; Refill: 0  3. BMI 31.0-31.9,adult  4. Obesity, Beginning BMI 31.95 Stephanie Lynch is currently in the action stage of change. As such, her goal is to continue with weight loss efforts. She has agreed to practicing portion control and making smarter food choices, such as  increasing vegetables and decreasing simple carbohydrates.   Exercise goals: As is.   Behavioral modification strategies: increasing lean protein intake and meal planning and cooking strategies.  Stephanie Lynch has agreed to follow-up with our clinic in 4 weeks. She was informed of the importance of frequent follow-up visits to maximize her success with intensive lifestyle modifications for her multiple health conditions.   Objective:   Blood pressure 132/78, pulse 75, temperature 98.1 F (36.7 C), height 5\' 7"  (1.702 m), weight 202 lb (91.6 kg), SpO2 98 %. Body mass index is 31.64 kg/m.  Lab Results  Component Value Date   CREATININE 0.78 10/13/2022   BUN 14 10/13/2022   NA 142 10/13/2022   K 4.5 10/13/2022   CL 102 10/13/2022   CO2 25 10/13/2022   Lab Results  Component Value Date   ALT 16 10/13/2022   AST 17 10/13/2022   ALKPHOS 115 10/13/2022   BILITOT 0.3 10/13/2022   Lab Results  Component Value Date   HGBA1C 6.0 (H) 10/13/2022   HGBA1C 5.8 (H) 04/08/2022   HGBA1C 6.1 12/23/2021   HGBA1C 5.8 08/22/2021   Lab Results  Component Value Date   INSULIN 15.0 10/13/2022   INSULIN 16.9 04/08/2022   Lab Results  Component Value Date   TSH 1.060 10/13/2022   Lab Results  Component Value Date   CHOL 181 10/13/2022   HDL 76 10/13/2022   LDLCALC 93 10/13/2022   LDLDIRECT 137.8 09/18/2010   TRIG 61 10/13/2022  CHOLHDL 3 08/22/2021   Lab Results  Component Value Date   VD25OH 49.4 10/13/2022   VD25OH 38.3 04/08/2022   VD25OH 89.70 12/23/2021   Lab Results  Component Value Date   WBC 3.8 04/08/2022   HGB 14.7 04/08/2022   HCT 44.0 04/08/2022   MCV 94 04/08/2022   PLT 259 04/08/2022   No results found for: "IRON", "TIBC", "FERRITIN"  Attestation Statements:   Reviewed by clinician on day of visit: allergies, medications, problem list, medical history, surgical history, family history, social history, and previous encounter notes.   I, Burt Knack, am  acting as transcriptionist for Quillian Quince, MD.  I have reviewed the above documentation for accuracy and completeness, and I agree with the above. -  Quillian Quince, MD

## 2022-12-31 NOTE — Progress Notes (Signed)
[name], am serving as a Neurosurgeon for Dr. Clementeen Graham.  Stephanie Lynch is a 52 y.o. female who presents to [facility] at [facility] today for bilat knee pain x a couple years progressively getting worse with being more active with the weight loss clinic. Started getting really painful about a month ago on her walks. Patient states that she moved three years ago to a two story and the stairs hurt a lot. Pt locates pain to front of patella. Patient states that she does not have pain if she is sitting or laying only on impact and now on walks especially up hill. Walks wears brooks and her other shoes she weras her berkinstocks. These are the shoes that have helped her a lot with her plantar faucitis.   Knee swelling: no Mechanical symptoms: yes Aggravates: walking up and down stairs, a certain workouts Treatments tried: rest  Pertinent review of systems: No fevers or chills  Relevant historical information: osteoporosis runs in family   Exam:  BP 120/84   Pulse 73   Ht 5\' 7"  (1.702 m)   Wt 206 lb (93.4 kg)   LMP  (LMP Unknown)   SpO2 98%   BMI 32.26 kg/m  General: Well Developed, well nourished, and in no acute distress.   MSK: Knees bilaterally normal appearing. Normal motion with crepitation. Intact strength.  Pain with resisted knee extension. Stable ligamentous exam.    Lab and Radiology Results  X-ray images bilateral knees obtained today personally and independently interpreted  Right knee: Mild patellofemoral DJD.  No acute fractures.  Left knee: Minimal patellofemoral DJD.  No acute fractures are visible.  Await formal radiology review     Assessment and Plan: 52 y.o. 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  She is a good candidate for physical therapy.  Plan to refer to PT.  Check back in 8 weeks.  Consider steroid injections if needed  in the interim.  Recommend also Voltaren gel.   PDMP not reviewed this encounter. Orders Placed This Encounter  Procedures   DG Knee AP/LAT W/Sunrise Left    Standing Status:   Future    Number of Occurrences:   1    Standing Expiration Date:   01/01/2024    Order Specific Question:   Reason for Exam (SYMPTOM  OR DIAGNOSIS REQUIRED)    Answer:   Bilateral knee pain    Order Specific Question:   Is patient pregnant?    Answer:   No    Order Specific Question:   Preferred imaging location?    Answer:   [facility]   DG Knee AP/LAT W/Sunrise Right    Standing Status:   Future    Number of Occurrences:   1    Standing Expiration Date:   01/01/2024    Order Specific Question:   Reason for Exam (SYMPTOM  OR DIAGNOSIS REQUIRED)    Answer:   Bilateral knee pain    Order Specific Question:   Is patient pregnant?    Answer:   No    Order Specific Question:   Preferred imaging location?    Answer:   [facility]   Ambulatory referral to Physical Therapy    Referral Priority:   Routine    Referral Type:   Physical Medicine    Referral Reason:   Specialty Services Required    Requested Specialty:   Physical  Therapy    Number of Visits Requested:   1   No orders of the defined types were placed in this encounter.    Discussed warning signs or symptoms. Please see discharge instructions. Patient expresses understanding.   The above documentation has been reviewed and is accurate and complete Clementeen Graham, M.D.

## 2023-01-01 ENCOUNTER — Ambulatory Visit (INDEPENDENT_AMBULATORY_CARE_PROVIDER_SITE_OTHER): Payer: BC Managed Care – PPO

## 2023-01-01 ENCOUNTER — Other Ambulatory Visit: Payer: Self-pay

## 2023-01-01 ENCOUNTER — Ambulatory Visit: Payer: BC Managed Care – PPO | Admitting: Family Medicine

## 2023-01-01 VITALS — BP 120/84 | HR 73 | Ht 67.0 in | Wt 206.0 lb

## 2023-01-01 DIAGNOSIS — G8929 Other chronic pain: Secondary | ICD-10-CM

## 2023-01-01 DIAGNOSIS — M25562 Pain in left knee: Secondary | ICD-10-CM | POA: Diagnosis not present

## 2023-01-01 DIAGNOSIS — M25561 Pain in right knee: Secondary | ICD-10-CM

## 2023-01-01 NOTE — Patient Instructions (Signed)
Thank you for coming in today.   I've referred you to Physical Therapy.  Let us know if you don't hear from them in one week.   Please get an Xray today before you leave   Please use Voltaren gel (Generic Diclofenac Gel) up to 4x daily for pain as needed.  This is available over-the-counter as both the name brand Voltaren gel and the generic diclofenac gel.   Recheck in 8 weeks. Let me know if this is not working.   I can do an injection any time.

## 2023-01-11 NOTE — Progress Notes (Signed)
Right knee x-ray looks normal to radiology

## 2023-01-11 NOTE — Progress Notes (Signed)
Left knee x-ray looks normal to radiology

## 2023-01-14 ENCOUNTER — Encounter (INDEPENDENT_AMBULATORY_CARE_PROVIDER_SITE_OTHER): Payer: Self-pay

## 2023-01-14 ENCOUNTER — Ambulatory Visit (INDEPENDENT_AMBULATORY_CARE_PROVIDER_SITE_OTHER): Payer: BC Managed Care – PPO | Admitting: Family Medicine

## 2023-01-14 NOTE — Therapy (Deleted)
OUTPATIENT PHYSICAL THERAPY LOWER EXTREMITY EVALUATION   Patient Name: Stephanie Lynch MRN: [id number] DOB:1971/02/27, 52 y.o., female Today's Date: 01/14/2023  END OF SESSION:   Past Medical History:  Diagnosis Date   Allergy    Anxiety    B12 deficiency    Constipation    Hashimoto's disease    High blood pressure    High cholesterol    IBS (irritable bowel syndrome)    Joint pain    Knee pain    Pre-diabetes    Thyroid disease    Vitamin D deficiency    Past Surgical History:  Procedure Laterality Date   WRIST SURGERY     Patient Active Problem List   Diagnosis Date Noted   Chronic idiopathic constipation 10/13/2022   Pre-diabetes 08/25/2022   BMI 31.0-31.9,adult 07/28/2022   Insulin resistance 05/06/2022   Stress 05/06/2022   Other constipation 04/21/2022   Other fatigue 04/08/2022   SOBOE (shortness of breath on exertion) 04/08/2022   Hyperlipidemia 04/08/2022   Depression screening 04/08/2022   Generalized obesity 04/08/2022   Prediabetes 06/17/2021   Low vitamin B12 level 06/17/2021   Hashimoto's disease 02/18/2021   Bilateral plantar fasciitis 09/26/2015   Vitamin D deficiency 08/08/2015   Myalgia and myositis 04/04/2015   Neck swelling 04/01/2015   Rosacea 01/12/2014   Anxiety and depression 05/18/2013   Eye pain 02/19/2012   Screening for malignant neoplasm of cervix 10/13/2011   Routine general medical examination at a health care facility 10/13/2011   KNEE PAIN 09/18/2010   Hypothyroidism 08/22/2006    PCP: Sheliah Hatch, MD  REFERRING PROVIDER: Rodolph Bong, MD  REFERRING DIAG: M25.561,M25.562,G89.29 (ICD-10-CM) - Chronic pain of both knees   THERAPY DIAG:  No diagnosis found.  Rationale for Evaluation and Treatment: Rehabilitation  ONSET DATE: 01/01/23  SUBJECTIVE:   SUBJECTIVE STATEMENT: ***  PERTINENT HISTORY: Per referring physician note:   52 y.o. female who presents to [facility] at [facility]  today for bilat knee pain x a couple years progressively getting worse with being more active with the weight loss clinic. Started getting really painful about a month ago on her walks. Patient states that she moved three years ago to a two story and the stairs hurt a lot. Pt locates pain to front of patella. Patient states that she does not have pain if she is sitting or laying only on impact and now on walks especially up hill. Walks wears brooks and her other shoes she weras her berkinstocks. These are the shoes that have helped her a lot with her plantar faucitis.  Assessment and Plan: 52 y.o. 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  She is a good candidate for physical therapy.  Plan to refer to PT.  Check back in 8 weeks.  Consider steroid injections if needed in the interim.  Recommend also Voltaren gel.  PAIN:  Are you having pain? Yes: NPRS scale: ***/10 Pain location: B knees Pain description: sharp in patella Aggravating factors: climbing steps or hills, certain workouts Relieving factors: est  PRECAUTIONS: {Therapy precautions:24002}  RED FLAGS: None   WEIGHT BEARING RESTRICTIONS: No  FALLS:  Has patient fallen in last 6 months? {fallsyesno:27318}  LIVING ENVIRONMENT: Lives with: {OPRC lives with:25569::"lives with their family"} Lives in: {Lives in:25570} Stairs: {opstairs:27293} Has following equipment at home: {Assistive devices:23999}  OCCUPATION: ***  PLOF: {PLOF:24004}  PATIENT GOALS: ***  NEXT MD VISIT: about 6  weeks  OBJECTIVE:   DIAGNOSTIC FINDINGS:  X-ray images bilateral knees obtained today personally and independently interpreted   Right knee: Mild patellofemoral DJD.  No acute fractures.   Left knee: Minimal patellofemoral DJD.  No acute fractures are visible  PATIENT SURVEYS:  {rehab surveys:24030}  COGNITION: Overall cognitive status: Within  functional limits for tasks assessed     SENSATION: {sensation:27233}  EDEMA:  {edema:24020}  MUSCLE LENGTH: Hamstrings: Right *** deg; Left *** deg Maisie Fus test: Right *** deg; Left *** deg  POSTURE: {posture:25561}  PALPATION: ***  LOWER EXTREMITY ROM:  {AROM/PROM:27142} ROM Right eval Left eval  Hip flexion    Hip extension    Hip abduction    Hip adduction    Hip internal rotation    Hip external rotation    Knee flexion    Knee extension    Ankle dorsiflexion    Ankle plantarflexion    Ankle inversion    Ankle eversion     (Blank rows = not tested)  LOWER EXTREMITY MMT:  MMT Right eval Left eval  Hip flexion    Hip extension    Hip abduction    Hip adduction    Hip internal rotation    Hip external rotation    Knee flexion    Knee extension    Ankle dorsiflexion    Ankle plantarflexion    Ankle inversion    Ankle eversion     (Blank rows = not tested)  LOWER EXTREMITY SPECIAL TESTS:  {LEspecialtests:26242}  FUNCTIONAL TESTS:  {Functional tests:24029}  GAIT: Distance walked: *** Assistive device utilized: {Assistive devices:23999} Level of assistance: {Levels of assistance:24026} Comments: ***   TODAY'S TREATMENT:                                                                                                                              DATE:  01/15/23 Education    PATIENT EDUCATION:  Education details: POC Person educated: Patient Education method: Explanation Education comprehension: verbalized understanding  HOME EXERCISE PROGRAM: ***  ASSESSMENT:  CLINICAL IMPRESSION: Patient is a 52 y.o. who was seen today for physical therapy evaluation and treatment for ***.   OBJECTIVE IMPAIRMENTS: Abnormal gait, decreased activity tolerance, decreased balance, decreased coordination, decreased mobility, difficulty walking, decreased ROM, decreased strength, and pain.   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   REHAB POTENTIAL: Good  CLINICAL DECISION MAKING: Stable/uncomplicated  EVALUATION COMPLEXITY: Low   GOALS: Goals reviewed with patient? Yes  SHORT TERM GOALS: Target date: 01/28/23 I with initial HEP Baseline: Goal status: INITIAL  LONG TERM GOALS: Target date: ***  I with final HEP Baseline:  Goal status: INITIAL  2.  *** Baseline:  Goal status: INITIAL  3.  *** Baseline:  Goal status: INITIAL  4.  ***  Baseline:  Goal status: INITIAL  5.  *** Baseline:  Goal status: INITIAL  6.  *** Baseline:  Goal status: INITIAL   PLAN:  PT FREQUENCY: 1-2x/week  PT DURATION: 10 weeks  PLANNED INTERVENTIONS: Therapeutic exercises, Therapeutic activity, Neuromuscular re-education, Balance training, Gait training, Patient/Family education, Self Care, Joint mobilization, Stair training, Dry Needling, Electrical stimulation, Cryotherapy, Moist heat, Vasopneumatic device, Ionotophoresis 4mg /ml Dexamethasone, and Manual therapy  PLAN FOR NEXT SESSION: ***   Iona Beard, DPT 01/14/2023, 1:18 PM

## 2023-01-15 ENCOUNTER — Ambulatory Visit: Payer: BC Managed Care – PPO | Admitting: Physical Therapy

## 2023-01-21 ENCOUNTER — Ambulatory Visit (INDEPENDENT_AMBULATORY_CARE_PROVIDER_SITE_OTHER): Payer: BC Managed Care – PPO | Admitting: Family Medicine

## 2023-01-21 ENCOUNTER — Encounter (INDEPENDENT_AMBULATORY_CARE_PROVIDER_SITE_OTHER): Payer: Self-pay | Admitting: Family Medicine

## 2023-01-21 ENCOUNTER — Other Ambulatory Visit (INDEPENDENT_AMBULATORY_CARE_PROVIDER_SITE_OTHER): Payer: Self-pay | Admitting: Family Medicine

## 2023-01-21 VITALS — BP 118/75 | HR 75 | Temp 97.7°F | Ht 67.0 in | Wt 203.0 lb

## 2023-01-21 DIAGNOSIS — K5904 Chronic idiopathic constipation: Secondary | ICD-10-CM | POA: Diagnosis not present

## 2023-01-21 DIAGNOSIS — E559 Vitamin D deficiency, unspecified: Secondary | ICD-10-CM | POA: Diagnosis not present

## 2023-01-21 DIAGNOSIS — R7303 Prediabetes: Secondary | ICD-10-CM

## 2023-01-21 DIAGNOSIS — E669 Obesity, unspecified: Secondary | ICD-10-CM | POA: Diagnosis not present

## 2023-01-21 DIAGNOSIS — Z6831 Body mass index (BMI) 31.0-31.9, adult: Secondary | ICD-10-CM

## 2023-01-21 MED ORDER — METFORMIN HCL 500 MG PO TABS: 500.0000 mg | ORAL_TABLET | Freq: Two times a day (BID) | ORAL | 0 refills | Status: DC

## 2023-01-21 NOTE — Progress Notes (Signed)
.sm[facility]  Office: [phone]  /  Fax: [phone]  WEIGHT SUMMARY AND BIOMETRICS  Anthropometric Measurements Height: 5\' 7"  (1.702 m) Weight: 203 lb (92.1 kg) BMI (Calculated): 31.79 Weight at Last Visit: 202 lb Weight Lost Since Last Visit: 0 Weight Gained Since Last Visit: 1 lb Starting Weight: 204 lb   Body Composition  Body Fat %: 40.1 % Fat Mass (lbs): 81.6 lbs Muscle Mass (lbs): 115.6 lbs Total Body Water (lbs): 76 lbs Visceral Fat Rating : 9   Other Clinical Data Fasting: No Labs: No Today's Visit #: 11 Starting Date: 04/08/22    Chief Complaint: OBESITY   History of Present Illness   Inaaya, a 52 year old individual with a history of obesity, prediabetes, and vitamin D deficiency, presents today with concerns about weight gain and constipation. She reports a weight gain of one pound over the past month despite adherence to a category two diet plan approximately 70% of the time and regular walking for 30-45 minutes 2-3 times per week.  She also expresses concerns about persistent constipation, which she describes as a feeling of fullness and bloating, despite various interventions including over-the-counter Miralax, magnesium supplements, and a prescription for metformin. She denies any discomfort associated with the constipation, aside from bloating.  Deloris also reports a history of hypothyroidism, which evolved into Hashimoto's disease, and she suspects that her current symptoms may be related to her thyroid condition. She expresses fatigue and a lack of energy, which she believes may be related to her thyroid condition or possibly poor sleep. She reports some improvement in sleep with the use of magnesium citrate gummies.  In addition to these concerns, she mentions joint aches and pains, particularly in the shoulder area, which she attributes to stress. She is scheduled to begin physical therapy for quad strengthening, which she hopes will alleviate some of her  discomfort.          PHYSICAL EXAM:  Blood pressure 118/75, pulse 75, temperature 97.7 F (36.5 C), height 5\' 7"  (1.702 m), weight 203 lb (92.1 kg), SpO2 94%. Body mass index is 31.79 kg/m.  DIAGNOSTIC DATA REVIEWED:  BMET    Component Value Date/Time   NA 142 10/13/2022 0911   K 4.5 10/13/2022 0911   CL 102 10/13/2022 0911   CO2 25 10/13/2022 0911   GLUCOSE 93 10/13/2022 0911   GLUCOSE 86 12/23/2021 1017   BUN 14 10/13/2022 0911   CREATININE 0.78 10/13/2022 0911   CREATININE 0.80 09/29/2013 1623   CALCIUM 9.7 10/13/2022 0911   Lab Results  Component Value Date   HGBA1C 6.0 (H) 10/13/2022   HGBA1C 5.8 08/22/2021   Lab Results  Component Value Date   INSULIN 15.0 10/13/2022   INSULIN 16.9 04/08/2022   Lab Results  Component Value Date   TSH 1.060 10/13/2022   CBC    Component Value Date/Time   WBC 3.8 04/08/2022 0826   WBC 3.7 (L) 08/22/2021 1054   RBC 4.66 04/08/2022 0826   RBC 4.51 08/22/2021 1054   HGB 14.7 04/08/2022 0826   HCT 44.0 04/08/2022 0826   PLT 259 04/08/2022 0826   MCV 94 04/08/2022 0826   MCH 31.5 04/08/2022 0826   MCH 31.6 09/29/2013 1623   MCHC 33.4 04/08/2022 0826   MCHC 32.5 08/22/2021 1054   RDW 12.6 04/08/2022 0826   Iron Studies No results found for: "IRON", "TIBC", "FERRITIN", "IRONPCTSAT" Lipid Panel     Component Value Date/Time   CHOL 181 10/13/2022 0911   TRIG 61 10/13/2022  0911   HDL 76 10/13/2022 0911   CHOLHDL 3 08/22/2021 1054   VLDL 17.2 08/22/2021 1054   LDLCALC 93 10/13/2022 0911   LDLDIRECT 137.8 09/18/2010 1058   Hepatic Function Panel     Component Value Date/Time   PROT 7.1 10/13/2022 0911   ALBUMIN 4.4 10/13/2022 0911   AST 17 10/13/2022 0911   ALT 16 10/13/2022 0911   ALKPHOS 115 10/13/2022 0911   BILITOT 0.3 10/13/2022 0911   BILIDIR 0.0 01/08/2015 1621   IBILI 0.3 09/29/2013 1623      Component Value Date/Time   TSH 1.060 10/13/2022 0911   Nutritional Lab Results  Component Value Date    VD25OH 49.4 10/13/2022   VD25OH 38.3 04/08/2022   VD25OH 89.70 12/23/2021     Assessment and Plan    Obesity: Minimal weight gain over the past month. Patient is adhering to category two plans 70% of the time and increasing physical activity. -Continue current diet and exercise regimen.  Constipation: Chronic issue, not relieved by Metformin or over-the-counter remedies. Patient reports bloating and discomfort. -Refer to Dr. Beverely Low for thyroid check, as hypothyroidism may be contributing to constipation. -Consider repeating metabolism test to assess for underlying issues.  Prediabetes: Patient is on Metformin, but recent labs showed an increase in A1C. -Continue Metformin as prescribed. -Check A1C if it has been over three months since last test.  Vitamin D Deficiency: Patient is on over-the-counter Vitamin D 1000 international units per day. -Continue current regimen.       I have personally spent 40 minutes total time today in preparation, patient care, and documentation for this visit, including the following: review of clinical lab tests; review of medical tests/procedures/services.    She was informed of the importance of frequent follow up visits to maximize her success with intensive lifestyle modifications for her multiple health conditions.    Quillian Quince, MD

## 2023-01-22 ENCOUNTER — Encounter: Payer: Self-pay | Admitting: Family Medicine

## 2023-01-22 ENCOUNTER — Ambulatory Visit: Payer: BC Managed Care – PPO | Admitting: Family Medicine

## 2023-01-22 VITALS — BP 128/76 | HR 70 | Temp 98.3°F | Resp 18 | Ht 67.0 in | Wt 206.1 lb

## 2023-01-22 DIAGNOSIS — R7989 Other specified abnormal findings of blood chemistry: Secondary | ICD-10-CM | POA: Diagnosis not present

## 2023-01-22 DIAGNOSIS — R5383 Other fatigue: Secondary | ICD-10-CM

## 2023-01-22 DIAGNOSIS — K5909 Other constipation: Secondary | ICD-10-CM

## 2023-01-22 DIAGNOSIS — E038 Other specified hypothyroidism: Secondary | ICD-10-CM

## 2023-01-22 DIAGNOSIS — E538 Deficiency of other specified B group vitamins: Secondary | ICD-10-CM | POA: Diagnosis not present

## 2023-01-22 DIAGNOSIS — E559 Vitamin D deficiency, unspecified: Secondary | ICD-10-CM

## 2023-01-22 DIAGNOSIS — E669 Obesity, unspecified: Secondary | ICD-10-CM

## 2023-01-22 DIAGNOSIS — R7303 Prediabetes: Secondary | ICD-10-CM | POA: Diagnosis not present

## 2023-01-22 DIAGNOSIS — E063 Autoimmune thyroiditis: Secondary | ICD-10-CM

## 2023-01-22 MED ORDER — LINACLOTIDE 145 MCG PO CAPS: 145.0000 ug | ORAL_CAPSULE | Freq: Every day | ORAL | 3 refills | Status: DC

## 2023-01-22 NOTE — Patient Instructions (Addendum)
Schedule your complete physical in 3 months We'll notify you of your lab results and make any changes if needed START the Linzess daily Continue to drink LOTS of water Try and get regular physical activity- it's hard to get started but it gets easier as you get going Call with any questions or concerns Stay Safe!  Stay Healthy! Hang in there!!

## 2023-01-22 NOTE — Progress Notes (Signed)
   Subjective:    Patient ID: Stephanie Lynch, female    DOB: 06-22-1971, 52 y.o.   MRN: [id number]  HPI Fatigue- pt has hx of hypothyroid, Vit D deficiency, B12 deficiency.  Struggling w/ constipation, skin is flaring.  'i'm tired of being tired'.  Pt is getting up to go the bathroom 2x/night but is able to go back to sleep.  Wakes up feeling rested but sxs worsen as the day goes on.  + hair loss.  Constipation- pt reports it takes a week of Miralax to get bowels moving.  Is taking Mag Citrate gummies w/o relief.  Pt doesn't feel like she ever has a complete bowel movement.     Review of Systems For ROS see HPI     Objective:   Physical Exam Vitals reviewed.  Constitutional:      General: She is not in acute distress.    Appearance: Normal appearance. She is well-developed. She is not ill-appearing.  HENT:     Head: Normocephalic and atraumatic.  Eyes:     Conjunctiva/sclera: Conjunctivae normal.     Pupils: Pupils are equal, round, and reactive to light.  Neck:     Thyroid: No thyromegaly.  Cardiovascular:     Rate and Rhythm: Normal rate and regular rhythm.     Pulses: Normal pulses.     Heart sounds: Normal heart sounds. No murmur heard. Pulmonary:     Effort: Pulmonary effort is normal. No respiratory distress.     Breath sounds: Normal breath sounds.  Abdominal:     General: There is no distension.     Palpations: Abdomen is soft.     Tenderness: There is no abdominal tenderness.  Musculoskeletal:     Cervical back: Normal range of motion and neck supple.     Right lower leg: No edema.     Left lower leg: No edema.  Lymphadenopathy:     Cervical: No cervical adenopathy.  Skin:    General: Skin is warm and dry.  Neurological:     General: No focal deficit present.     Mental Status: She is alert and oriented to person, place, and time.  Psychiatric:        Mood and Affect: Mood normal.        Behavior: Behavior normal.        Thought Content: Thought content  normal.          Assessment & Plan:

## 2023-01-23 LAB — CBC WITH DIFFERENTIAL/PLATELET
Absolute Monocytes: 380 cells/uL (ref 200–950)
Basophils Absolute: 61 cells/uL (ref 0–200)
Basophils Relative: 1.6 %
Eosinophils Absolute: 61 cells/uL (ref 15–500)
Eosinophils Relative: 1.6 %
HCT: 43 % (ref 35.0–45.0)
Hemoglobin: 14.1 g/dL (ref 11.7–15.5)
Lymphs Abs: 1395 cells/uL (ref 850–3900)
MCH: 31.2 pg (ref 27.0–33.0)
MCHC: 32.8 g/dL (ref 32.0–36.0)
MCV: 95.1 fL (ref 80.0–100.0)
MPV: 10.6 fL (ref 7.5–12.5)
Monocytes Relative: 10 %
Neutro Abs: 1904 cells/uL (ref 1500–7800)
Neutrophils Relative %: 50.1 %
Platelets: 258 10*3/uL (ref 140–400)
RBC: 4.52 10*6/uL (ref 3.80–5.10)
RDW: 12.8 % (ref 11.0–15.0)
Total Lymphocyte: 36.7 %
WBC: 3.8 10*3/uL (ref 3.8–10.8)

## 2023-01-23 LAB — T3, FREE: T3, Free: 3 pg/mL (ref 2.3–4.2)

## 2023-01-23 LAB — BASIC METABOLIC PANEL
BUN: 14 mg/dL (ref 7–25)
CO2: 27 mmol/L (ref 20–32)
Calcium: 9.4 mg/dL (ref 8.6–10.4)
Chloride: 104 mmol/L (ref 98–110)
Creat: 0.73 mg/dL (ref 0.50–1.03)
Glucose, Bld: 94 mg/dL (ref 65–99)
Potassium: 4.3 mmol/L (ref 3.5–5.3)
Sodium: 139 mmol/L (ref 135–146)

## 2023-01-23 LAB — HEPATIC FUNCTION PANEL
AG Ratio: 1.8 (calc) (ref 1.0–2.5)
ALT: 11 U/L (ref 6–29)
AST: 13 U/L (ref 10–35)
Albumin: 4.5 g/dL (ref 3.6–5.1)
Alkaline phosphatase (APISO): 100 U/L (ref 37–153)
Bilirubin, Direct: 0.1 mg/dL (ref 0.0–0.2)
Globulin: 2.5 g/dL (calc) (ref 1.9–3.7)
Indirect Bilirubin: 0.3 mg/dL (calc) (ref 0.2–1.2)
Total Bilirubin: 0.4 mg/dL (ref 0.2–1.2)
Total Protein: 7 g/dL (ref 6.1–8.1)

## 2023-01-23 LAB — HEMOGLOBIN A1C
Hgb A1c MFr Bld: 5.9 % of total Hgb — ABNORMAL HIGH (ref <5.7)
Mean Plasma Glucose: 123 mg/dL
eAG (mmol/L): 6.8 mmol/L

## 2023-01-23 LAB — VITAMIN D 25 HYDROXY (VIT D DEFICIENCY, FRACTURES): Vit D, 25-Hydroxy: 50 ng/mL (ref 30–100)

## 2023-01-23 LAB — TSH: TSH: 0.98 mIU/L

## 2023-01-23 LAB — B12 AND FOLATE PANEL
Folate: 13.6 ng/mL
Vitamin B-12: 470 pg/mL (ref 200–1100)

## 2023-01-23 LAB — T4, FREE: Free T4: 1.4 ng/dL (ref 0.8–1.8)

## 2023-01-25 ENCOUNTER — Ambulatory Visit: Payer: BC Managed Care – PPO | Attending: Family Medicine

## 2023-01-25 ENCOUNTER — Encounter: Payer: Self-pay | Admitting: Family Medicine

## 2023-01-25 ENCOUNTER — Telehealth: Payer: Self-pay

## 2023-01-25 ENCOUNTER — Ambulatory Visit: Payer: BC Managed Care – PPO | Admitting: Family Medicine

## 2023-01-25 DIAGNOSIS — M25561 Pain in right knee: Secondary | ICD-10-CM | POA: Insufficient documentation

## 2023-01-25 DIAGNOSIS — Z789 Other specified health status: Secondary | ICD-10-CM | POA: Insufficient documentation

## 2023-01-25 DIAGNOSIS — G8929 Other chronic pain: Secondary | ICD-10-CM | POA: Diagnosis not present

## 2023-01-25 DIAGNOSIS — M6281 Muscle weakness (generalized): Secondary | ICD-10-CM | POA: Diagnosis not present

## 2023-01-25 DIAGNOSIS — M25562 Pain in left knee: Secondary | ICD-10-CM | POA: Insufficient documentation

## 2023-01-25 MED ORDER — LEVOTHYROXINE SODIUM 88 MCG PO TABS: 88.0000 ug | ORAL_TABLET | Freq: Every day | ORAL | 1 refills | Status: DC

## 2023-01-25 NOTE — Telephone Encounter (Signed)
Pt seen results Via my chart  

## 2023-01-25 NOTE — Therapy (Signed)
OUTPATIENT PHYSICAL THERAPY LOWER EXTREMITY EVALUATION   Patient Name: Stephanie Lynch MRN: [id number] DOB:Sep 16, 1970, 52 y.o., female Today's Date: 01/25/2023  END OF SESSION:  PT End of Session - 01/25/23 1654     Visit Number 1    Date for PT Re-Evaluation 04/05/23    Authorization Type [employer]    PT Start Time 1655    PT Stop Time 1740    PT Time Calculation (min) 45 min    Activity Tolerance Patient tolerated treatment well    Behavior During Therapy WFL for tasks assessed/performed             Past Medical History:  Diagnosis Date   Allergy    Anxiety    B12 deficiency    Constipation    Hashimoto's disease    High blood pressure    High cholesterol    IBS (irritable bowel syndrome)    Joint pain    Knee pain    Pre-diabetes    Thyroid disease    Vitamin D deficiency    Past Surgical History:  Procedure Laterality Date   WRIST SURGERY     Patient Active Problem List   Diagnosis Date Noted   Chronic idiopathic constipation 10/13/2022   Pre-diabetes 08/25/2022   BMI 31.0-31.9,adult 07/28/2022   Insulin resistance 05/06/2022   Stress 05/06/2022   Other constipation 04/21/2022   Other fatigue 04/08/2022   SOBOE (shortness of breath on exertion) 04/08/2022   Hyperlipidemia 04/08/2022   Depression screening 04/08/2022   Generalized obesity 04/08/2022   Prediabetes 06/17/2021   Low vitamin B12 level 06/17/2021   Hashimoto's disease 02/18/2021   Bilateral plantar fasciitis 09/26/2015   Vitamin D deficiency 08/08/2015   Myalgia and myositis 04/04/2015   Neck swelling 04/01/2015   Rosacea 01/12/2014   Anxiety and depression 05/18/2013   Screening for malignant neoplasm of cervix 10/13/2011   Routine general medical examination at a health care facility 10/13/2011   Hypothyroidism 08/22/2006    PCP: Sheliah Hatch, MD  REFERRING PROVIDER: Rodolph Bong, MD  REFERRING DIAG: M25.561,M25.562,G89.29 (ICD-10-CM) - Chronic pain of both knees    THERAPY DIAG:  Acute pain of both knees  Muscle weakness (generalized)  Difficulty navigating stairs  Rationale for Evaluation and Treatment: Rehabilitation  ONSET DATE: 01/01/23  SUBJECTIVE:   SUBJECTIVE STATEMENT: Tired, it has been a Monday. Stairs are my biggest issue. 3 years ago I moved into a 2 story house and it has progressively gotten worse, especially in the last few months. Started walking more and by the 4-5 mile mark and inclines it starts to hurt.   PERTINENT HISTORY: Per referring physician note:  52 y.o. female who presents to [facility] at [facility] today for bilat knee pain x a couple years progressively getting worse with being more active with the weight loss clinic. Started getting really painful about a month ago on her walks. Patient states that she moved three years ago to a two story and the stairs hurt a lot. Pt locates pain to front of patella. Patient states that she does not have pain if she is sitting or laying only on impact and now on walks especially up hill. Walks wears brooks and her other shoes she weras her berkinstocks. These are the shoes that have helped her a lot with her plantar faucitis.  Assessment and Plan: 52 y.o. 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  She is  a good candidate for physical therapy.  Plan to refer to PT.  Check back in 8 weeks.  Consider steroid injections if needed in the interim.  Recommend also Voltaren gel.  PAIN:  Are you having pain? Yes: NPRS scale: 6/10 Pain location: B knees Pain description: sharp in patella Aggravating factors: climbing steps or hills, certain workouts Relieving factors: nothing  PRECAUTIONS: None  RED FLAGS: None   WEIGHT BEARING RESTRICTIONS: No  FALLS:  Has patient fallen in last 6 months? No  LIVING ENVIRONMENT: Lives with: lives with their family Lives in:  House/apartment Stairs: Yes: Internal: 15 steps; on right going up and External: 4 steps; on right going up  OCCUPATION: Desk job   PLOF: Independent  PATIENT GOALS: strengthening and be able to do stairs without pain   NEXT MD VISIT: about 6 weeks  OBJECTIVE:   DIAGNOSTIC FINDINGS:  X-ray images bilateral knees obtained today personally and independently interpreted   Right knee: Mild patellofemoral DJD.  No acute fractures.   Left knee: Minimal patellofemoral DJD.  No acute fractures are visible  PATIENT SURVEYS:  FOTO 57  COGNITION: Overall cognitive status: Within functional limits for tasks assessed     SENSATION: WFL   MUSCLE LENGTH: Hamstrings: some tightness in BLE   POSTURE: No Significant postural limitations  PALPATION: No remarkable findings   LOWER EXTREMITY ROM: grossly WFL    LOWER EXTREMITY MMT: grossly 5/5, R knee ext 4+/5 with pain    FUNCTIONAL TESTS:  5 times sit to stand: 15.33s    TODAY'S TREATMENT:                                                                                                                              DATE:  01/15/23 Education    PATIENT EDUCATION:  Education details: POC Person educated: Patient Education method: Explanation Education comprehension: verbalized understanding  HOME EXERCISE PROGRAM: Access Code: [email or web address] URL: [email or web address] Date: 01/25/2023 Prepared by: Cassie Freer  Exercises - Side Stepping with Resistance at Ankles  - 1 x daily - 7 x weekly - 2 sets - 10 reps - Sit to Stand  - 1 x daily - 7 x weekly - 2 sets - 10 reps - Step Up  - 1 x daily - 7 x weekly - 2 sets - 10 reps - Seated Knee Extension with Resistance  - 1 x daily - 7 x weekly - 2 sets - 10 reps - Seated Hamstring Stretch  - 1 x daily - 7 x weekly - 2 reps - 30 hold  ASSESSMENT:  CLINICAL IMPRESSION: Patient is a 52 y.o. who was seen today for physical therapy evaluation and treatment for bilateral  knee pain. There is audible crepitus in bilateral knees especially heard with stairs and sit to stands today. She reports her pain is mostly in the front of her knees and she feels it the most when going up and down stairs. X rays show  mild patellofemoral DJD. Her bilateral anterior knee pain and crepitation is thought to be due to patellofemoral pain syndrome/patellofemoral chondromalacia according to Dr.Corey. She will benefit from skilled PT to work on mainly strengthening to be able to walk and navigate stairs without pain.   OBJECTIVE IMPAIRMENTS: Abnormal gait, decreased activity tolerance, decreased balance, decreased coordination, decreased mobility, difficulty walking, decreased ROM, decreased strength, and pain.   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   REHAB POTENTIAL: Good  CLINICAL DECISION MAKING: Stable/uncomplicated  EVALUATION COMPLEXITY: Low   GOALS: Goals reviewed with patient? Yes  SHORT TERM GOALS: Target date: 03/01/23 I with initial HEP Baseline: Goal status: INITIAL  LONG TERM GOALS: Target date: 04/05/23  I with final HEP Baseline:  Goal status: INITIAL  2.  Patient will be able to go up and down a flight of stairs without pain Baseline: pain with stairs Goal status: INITIAL  3.  Patient will be able to walk on uneven surfaces without pain  Baseline: pain with uneven grounds  Goal status: INITIAL  4.  Patient will report overall pain levels <2/10 or better  Baseline: 5-6/10 Goal status: INITIAL  PLAN:  PT FREQUENCY: 1-2x/week  PT DURATION: 10 weeks  PLANNED INTERVENTIONS: Therapeutic exercises, Therapeutic activity, Neuromuscular re-education, Balance training, Gait  training, Patient/Family education, Self Care, Joint mobilization, Stair training, Dry Needling, Electrical stimulation, Cryotherapy, Moist heat, Vasopneumatic device, Ionotophoresis 4mg /ml Dexamethasone, and Manual therapy  PLAN FOR NEXT SESSION: knee strengthening    Cassie Freer, DPT 01/25/2023, 5:33 PM

## 2023-01-25 NOTE — Telephone Encounter (Signed)
-----   Message from Neena Rhymes sent at 01/25/2023  7:29 AM EDT ----- Labs look great!  No obvious cause of fatigue.  Sometimes there is an emotional fatigue that can be associated with anxiety and depression.  This is something we need to be mindful of and we could consider treatment if needed

## 2023-01-26 NOTE — Assessment & Plan Note (Signed)
Deteriorated. Pt reports it takes Miralax a week to work.  No relief w/ Mag Citrate.  Has seen GI but sxs have not improved.  Will start Linzess and monitor for improvement.  Pt expressed understanding and is in agreement w/ plan.

## 2023-01-26 NOTE — Assessment & Plan Note (Signed)
Pt reports she is 'tired of being tired'.  She has a hx of hypothyroid, Vit D deficiency, and B12 deficiency that could all be contributing.  Wakes up feeling rested which makes OSA less likely.  Check labs to assess for underlying metabolic cause.  Will follow.

## 2023-01-27 ENCOUNTER — Ambulatory Visit: Payer: BC Managed Care – PPO | Admitting: Physical Therapy

## 2023-01-27 ENCOUNTER — Encounter: Payer: Self-pay | Admitting: Physical Therapy

## 2023-01-27 DIAGNOSIS — M6281 Muscle weakness (generalized): Secondary | ICD-10-CM

## 2023-01-27 DIAGNOSIS — M25561 Pain in right knee: Secondary | ICD-10-CM | POA: Diagnosis not present

## 2023-01-27 DIAGNOSIS — M25562 Pain in left knee: Secondary | ICD-10-CM

## 2023-01-27 DIAGNOSIS — G8929 Other chronic pain: Secondary | ICD-10-CM | POA: Diagnosis not present

## 2023-01-27 DIAGNOSIS — Z789 Other specified health status: Secondary | ICD-10-CM

## 2023-01-27 NOTE — Therapy (Signed)
OUTPATIENT PHYSICAL THERAPY LOWER EXTREMITY EVALUATION   Patient Name: Stephanie Lynch MRN: [id number] DOB:04-May-1971, 52 y.o., female Today's Date: 01/27/2023  END OF SESSION:    Past Medical History:  Diagnosis Date   Allergy    Anxiety    B12 deficiency    Constipation    Hashimoto's disease    High blood pressure    High cholesterol    IBS (irritable bowel syndrome)    Joint pain    Knee pain    Pre-diabetes    Thyroid disease    Vitamin D deficiency    Past Surgical History:  Procedure Laterality Date   WRIST SURGERY     Patient Active Problem List   Diagnosis Date Noted   Chronic idiopathic constipation 10/13/2022   Pre-diabetes 08/25/2022   BMI 31.0-31.9,adult 07/28/2022   Insulin resistance 05/06/2022   Stress 05/06/2022   Other constipation 04/21/2022   Other fatigue 04/08/2022   SOBOE (shortness of breath on exertion) 04/08/2022   Hyperlipidemia 04/08/2022   Depression screening 04/08/2022   Generalized obesity 04/08/2022   Prediabetes 06/17/2021   Low vitamin B12 level 06/17/2021   Hashimoto's disease 02/18/2021   Bilateral plantar fasciitis 09/26/2015   Vitamin D deficiency 08/08/2015   Myalgia and myositis 04/04/2015   Neck swelling 04/01/2015   Rosacea 01/12/2014   Anxiety and depression 05/18/2013   Screening for malignant neoplasm of cervix 10/13/2011   Routine general medical examination at a health care facility 10/13/2011   Hypothyroidism 08/22/2006    PCP: Sheliah Hatch, MD  REFERRING PROVIDER: Rodolph Bong, MD  REFERRING DIAG: M25.561,M25.562,G89.29 (ICD-10-CM) - Chronic pain of both knees   THERAPY DIAG:  No diagnosis found.  Rationale for Evaluation and Treatment: Rehabilitation  ONSET DATE: 01/01/23  SUBJECTIVE:   SUBJECTIVE STATEMENT: Patient reports that she is trying to incorporate her HEP into her daily routine.  PERTINENT HISTORY: Per referring physician note:  52 y.o. female who presents to [facility] at [facility] today for bilat knee pain x a couple years progressively getting worse with being more active with the weight loss clinic. Started getting really painful about a month ago on her walks. Patient states that she moved three years ago to a two story and the stairs hurt a lot. Pt locates pain to front of patella. Patient states that she does not have pain if she is sitting or laying only on impact and now on walks especially up hill. Walks wears brooks and her other shoes she weras her berkinstocks. These are the shoes that have helped her a lot with her plantar faucitis.  Assessment and Plan: 52 y.o. 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  She is a good candidate for physical therapy.  Plan to refer to PT.  Check back in 8 weeks.  Consider steroid injections if needed in the interim.  Recommend also Voltaren gel.  PAIN:  Are you having pain? Yes: NPRS scale: 6/10 Pain location: B knees Pain description: sharp in patella Aggravating factors: climbing steps or hills, certain workouts Relieving factors: nothing  PRECAUTIONS: None  RED FLAGS: None   WEIGHT BEARING RESTRICTIONS: No  FALLS:  Has patient fallen in last 6 months? No  LIVING ENVIRONMENT: Lives with: lives with their family Lives in: House/apartment Stairs: Yes: Internal: 15 steps; on right going up and External: 4 steps; on right going up  OCCUPATION: Desk job   PLOF: Independent  PATIENT GOALS: strengthening and be able to do stairs without pain   NEXT MD VISIT: about 6 weeks  OBJECTIVE:   DIAGNOSTIC FINDINGS:  X-ray images bilateral knees obtained today personally and independently interpreted   Right knee: Mild patellofemoral DJD.  No acute fractures.   Left knee: Minimal patellofemoral DJD.  No acute fractures are visible  PATIENT SURVEYS:  FOTO 57  COGNITION: Overall cognitive  status: Within functional limits for tasks assessed     SENSATION: WFL   MUSCLE LENGTH: Hamstrings: some tightness in BLE   POSTURE: No Significant postural limitations  PALPATION: No remarkable findings   LOWER EXTREMITY ROM: grossly WFL    LOWER EXTREMITY MMT: grossly 5/5, R knee ext 4+/5 with pain   FUNCTIONAL TESTS:  5 times sit to stand: 15.33s   TODAY'S TREATMENT:                                                                                                                              DATE:  01/27/23 NuStep L5 x 5 min Supine strengthening exercises- PPT, progress into bridge, clamshells, x 10 Seated long kicks, knee flex against G tband x 10 Mini squats with 6# x 10 Standing shoulder ext, rows, ER, 2 x 10 G tband  01/15/23 Education    PATIENT EDUCATION:  Education details: POC Person educated: Patient Education method: Explanation Education comprehension: verbalized understanding  HOME EXERCISE PROGRAM: Access Code: [email or web address] URL: [email or web address] Date: 01/25/2023 Prepared by: Cassie Freer  Exercises - Side Stepping with Resistance at Ankles  - 1 x daily - 7 x weekly - 2 sets - 10 reps - Sit to Stand  - 1 x daily - 7 x weekly - 2 sets - 10 reps - Step Up  - 1 x daily - 7 x weekly - 2 sets - 10 reps - Seated Knee Extension with Resistance  - 1 x daily - 7 x weekly - 2 sets - 10 reps - Seated Hamstring Stretch  - 1 x daily - 7 x weekly - 2 reps - 30 hold  ASSESSMENT:  CLINICAL IMPRESSION: Patient is performing some of the HEP at work while sitting at her desk. Expanded on the sitting exercises as well as adding some additional strengthening exercises for hips and knees. She tolerated all well except the mini squats. Updated Hep and patient will perform the exercises and report back next week on how they felt on her knees.  OBJECTIVE IMPAIRMENTS: Abnormal gait, decreased activity tolerance, decreased balance, decreased coordination,  decreased mobility, difficulty walking, decreased ROM, decreased strength, and pain.   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   REHAB POTENTIAL: Good  CLINICAL DECISION MAKING: Stable/uncomplicated  EVALUATION COMPLEXITY: Low   GOALS: Goals reviewed with patient? Yes  SHORT TERM GOALS: Target  date: 03/01/23 I with initial HEP Baseline: Goal status: INITIAL  LONG TERM GOALS: Target date: 04/05/23  I with final HEP Baseline:  Goal status: INITIAL  2.  Patient will be able to go up and down a flight of stairs without pain Baseline: pain with stairs Goal status: INITIAL  3.  Patient will be able to walk on uneven surfaces without pain  Baseline: pain with uneven grounds  Goal status: INITIAL  4.  Patient will report overall pain levels <2/10 or better  Baseline: 5-6/10 Goal status: INITIAL  PLAN:  PT FREQUENCY: 1-2x/week  PT DURATION: 10 weeks  PLANNED INTERVENTIONS: Therapeutic exercises, Therapeutic activity, Neuromuscular re-education, Balance training, Gait training, Patient/Family education, Self Care, Joint mobilization, Stair training, Dry Needling, Electrical stimulation, Cryotherapy, Moist heat, Vasopneumatic device, Ionotophoresis 4mg /ml Dexamethasone, and Manual therapy  PLAN FOR NEXT SESSION: knee strengthening    Oley Balm DPT 01/27/23 6:07 PM  01/27/2023, 6:07 PM

## 2023-02-02 ENCOUNTER — Ambulatory Visit: Payer: BC Managed Care – PPO | Admitting: Physical Therapy

## 2023-02-02 ENCOUNTER — Telehealth: Payer: Self-pay | Admitting: Family Medicine

## 2023-02-02 DIAGNOSIS — M25561 Pain in right knee: Secondary | ICD-10-CM | POA: Diagnosis not present

## 2023-02-02 DIAGNOSIS — M25562 Pain in left knee: Secondary | ICD-10-CM | POA: Diagnosis not present

## 2023-02-02 DIAGNOSIS — Z789 Other specified health status: Secondary | ICD-10-CM | POA: Diagnosis not present

## 2023-02-02 DIAGNOSIS — G8929 Other chronic pain: Secondary | ICD-10-CM | POA: Diagnosis not present

## 2023-02-02 DIAGNOSIS — M6281 Muscle weakness (generalized): Secondary | ICD-10-CM

## 2023-02-02 NOTE — Therapy (Signed)
OUTPATIENT PHYSICAL THERAPY LOWER EXTREMITY    Patient Name: Stephanie Lynch MRN: [id number] DOB:09-28-1970, 52 y.o., female Today's Date: 02/02/2023  END OF SESSION:  PT End of Session - 02/02/23 0759     Visit Number 3    Date for PT Re-Evaluation 04/05/23    Authorization Type BCBS    PT Start Time 0800    PT Stop Time 0845    PT Time Calculation (min) 45 min              Past Medical History:  Diagnosis Date   Allergy    Anxiety    B12 deficiency    Constipation    Hashimoto's disease    High blood pressure    High cholesterol    IBS (irritable bowel syndrome)    Joint pain    Knee pain    Pre-diabetes    Thyroid disease    Vitamin D deficiency    Past Surgical History:  Procedure Laterality Date   WRIST SURGERY     Patient Active Problem List   Diagnosis Date Noted   Chronic idiopathic constipation 10/13/2022   Pre-diabetes 08/25/2022   BMI 31.0-31.9,adult 07/28/2022   Insulin resistance 05/06/2022   Stress 05/06/2022   Other constipation 04/21/2022   Other fatigue 04/08/2022   SOBOE (shortness of breath on exertion) 04/08/2022   Hyperlipidemia 04/08/2022   Depression screening 04/08/2022   Generalized obesity 04/08/2022   Prediabetes 06/17/2021   Low vitamin B12 level 06/17/2021   Hashimoto's disease 02/18/2021   Bilateral plantar fasciitis 09/26/2015   Vitamin D deficiency 08/08/2015   Myalgia and myositis 04/04/2015   Neck swelling 04/01/2015   Rosacea 01/12/2014   Anxiety and depression 05/18/2013   Screening for malignant neoplasm of cervix 10/13/2011   Routine general medical examination at a health care facility 10/13/2011   Hypothyroidism 08/22/2006    PCP: Sheliah Hatch, MD  REFERRING PROVIDER: Rodolph Bong, MD  REFERRING DIAG: M25.561,M25.562,G89.29 (ICD-10-CM) - Chronic pain of both knees   THERAPY DIAG:  Muscle weakness (generalized)  Acute pain of both knees  Rationale for Evaluation and Treatment:  Rehabilitation  ONSET DATE: 01/01/23  SUBJECTIVE:   SUBJECTIVE STATEMENT: Ex are going well. Up steps is better, down is still an issue  PERTINENT HISTORY: Per referring physician note:  52 y.o. female who presents to [facility] at [facility] today for bilat knee pain x a couple years progressively getting worse with being more active with the weight loss clinic. Started getting really painful about a month ago on her walks. Patient states that she moved three years ago to a two story and the stairs hurt a lot. Pt locates pain to front of patella. Patient states that she does not have pain if she is sitting or laying only on impact and now on walks especially up hill. Walks wears brooks and her other shoes she weras her berkinstocks. These are the shoes that have helped her a lot with her plantar faucitis.  Assessment and Plan: 52 y.o. 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  She is a good candidate for physical therapy.  Plan to refer to PT.  Check back in 8 weeks.  Consider steroid injections if needed in the interim.  Recommend also Voltaren gel.  PAIN:  Are you having pain? Yes: NPRS scale: 2-3/10 Pain location: B knees Pain description: sharp in patella Aggravating factors: climbing steps or hills,  certain workouts Relieving factors: nothing  PRECAUTIONS: None  RED FLAGS: None   WEIGHT BEARING RESTRICTIONS: No  FALLS:  Has patient fallen in last 6 months? No  LIVING ENVIRONMENT: Lives with: lives with their family Lives in: House/apartment Stairs: Yes: Internal: 15 steps; on right going up and External: 4 steps; on right going up  OCCUPATION: Desk job   PLOF: Independent  PATIENT GOALS: strengthening and be able to do stairs without pain   NEXT MD VISIT: about 6 weeks  OBJECTIVE:   DIAGNOSTIC FINDINGS:  X-ray images bilateral knees obtained today  personally and independently interpreted   Right knee: Mild patellofemoral DJD.  No acute fractures.   Left knee: Minimal patellofemoral DJD.  No acute fractures are visible  PATIENT SURVEYS:  FOTO 57  COGNITION: Overall cognitive status: Within functional limits for tasks assessed     SENSATION: WFL   MUSCLE LENGTH: Hamstrings: some tightness in BLE   POSTURE: No Significant postural limitations  PALPATION: No remarkable findings   LOWER EXTREMITY ROM: grossly WFL    LOWER EXTREMITY MMT: grossly 5/5, R knee ext 4+/5 with pain   FUNCTIONAL TESTS:  5 times sit to stand: 15.33s   TODAY'S TREATMENT:                                                                                                                              DATE:   02/02/23 Nustep L 5 LE only Leg Press 10 x feet 3 way 10 each 40# . SL 10 x each 20# Calf Raise on leg press 40# 2 sets 15 Resisted gait 4 way 5 x each 30# TM OFF push and pull 10 x BIL HS BIL 20# 15x, 15# 10 SL Knee ext 10# 10 x hold 3 sec. SL 2 sets 5 LAQ green tband 10x hold 3 sec- added to HEP BOSU step up fwd and laterally 10 x each     01/27/23 NuStep L5 x 5 min Supine strengthening exercises- PPT, progress into bridge, clamshells, x 10 Seated long kicks, knee flex against G tband x 10 Mini squats with 6# x 10 Standing shoulder ext, rows, ER, 2 x 10 G tband  01/15/23 Education    PATIENT EDUCATION:  Education details: POC Person educated: Patient Education method: Explanation Education comprehension: verbalized understanding  HOME EXERCISE PROGRAM: Access Code: [email or web address] URL: [email or web address] Date: 01/25/2023 Prepared by: Cassie Freer  Exercises - Side Stepping with Resistance at Ankles  - 1 x daily - 7 x weekly - 2 sets - 10 reps - Sit to Stand  - 1 x daily - 7 x weekly - 2 sets - 10 reps - Step Up  - 1 x daily - 7 x weekly - 2 sets - 10 reps - Seated Knee Extension with Resistance  - 1 x  daily - 7 x weekly - 2 sets - 10 reps - Seated Hamstring Stretch  - 1 x daily - 7 x  weekly - 2 reps - 30 hold  ASSESSMENT:  CLINICAL IMPRESSION:  pt arrives feeling some better. States up steps is better now then down. Overall feels HEP is helping. Progressed knee and hip strength with cuing. STG met OBJECTIVE IMPAIRMENTS: Abnormal gait, decreased activity tolerance, decreased balance, decreased coordination, decreased mobility, difficulty walking, decreased ROM, decreased strength, and pain.   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   REHAB POTENTIAL: Good  CLINICAL DECISION MAKING: Stable/uncomplicated  EVALUATION COMPLEXITY: Low   GOALS: Goals reviewed with patient? Yes  SHORT TERM GOALS: Target date: 03/01/23 I with initial HEP Baseline: Goal status: 02/02/23 MET  LONG TERM GOALS: Target date: 04/05/23  I with final HEP Baseline:  Goal status: INITIAL  2.  Patient will be able to go up and down a flight of stairs without pain Baseline: pain with stairs Goal status: INITIAL  3.  Patient will be able to walk on uneven surfaces without pain  Baseline: pain with uneven grounds  Goal status: INITIAL  4.  Patient will report overall pain levels <2/10 or better  Baseline: 5-6/10 Goal status: INITIAL  PLAN:  PT FREQUENCY: 1-2x/week  PT DURATION: 10 weeks  PLANNED INTERVENTIONS: Therapeutic exercises, Therapeutic activity, Neuromuscular re-education, Balance training, Gait training, Patient/Family education, Self Care, Joint mobilization, Stair training, Dry Needling, Electrical stimulation, Cryotherapy, Moist heat, Vasopneumatic device, Ionotophoresis 4mg /ml Dexamethasone, and Manual therapy  PLAN  FOR NEXT SESSION: knee strengthening    Patient Details  Name: ALYSSA MAJCHER MRN: [id number] Date of Birth: 11-04-1970 Referring Provider:  Rodolph Bong, MD  Encounter Date: 02/02/2023   Suanne Marker, PTA 02/02/2023, 8:02 AM  [facility] [address] [place], [place], [address] Phone: [phone]   Fax:  [phone]

## 2023-02-02 NOTE — Telephone Encounter (Signed)
Added codes and form has been faxed

## 2023-02-02 NOTE — Telephone Encounter (Signed)
[facility] - Missing Information   Placed in front bin

## 2023-02-03 ENCOUNTER — Encounter (INDEPENDENT_AMBULATORY_CARE_PROVIDER_SITE_OTHER): Payer: Self-pay | Admitting: Family Medicine

## 2023-02-03 ENCOUNTER — Encounter: Payer: Self-pay | Admitting: Family Medicine

## 2023-02-04 NOTE — Therapy (Signed)
OUTPATIENT PHYSICAL THERAPY LOWER EXTREMITY    Patient Name: Stephanie Lynch MRN: [id number] DOB:03-23-1971, 52 y.o., female Today's Date: 02/05/2023  END OF SESSION:  PT End of Session - 02/05/23 0931     Visit Number 4    Date for PT Re-Evaluation 04/05/23    Authorization Type BCBS    PT Start Time 0930    PT Stop Time 1015    PT Time Calculation (min) 45 min               Past Medical History:  Diagnosis Date   Allergy    Anxiety    B12 deficiency    Constipation    Hashimoto's disease    High blood pressure    High cholesterol    IBS (irritable bowel syndrome)    Joint pain    Knee pain    Pre-diabetes    Thyroid disease    Vitamin D deficiency    Past Surgical History:  Procedure Laterality Date   WRIST SURGERY     Patient Active Problem List   Diagnosis Date Noted   Chronic idiopathic constipation 10/13/2022   Pre-diabetes 08/25/2022   BMI 31.0-31.9,adult 07/28/2022   Insulin resistance 05/06/2022   Stress 05/06/2022   Other constipation 04/21/2022   Other fatigue 04/08/2022   SOBOE (shortness of breath on exertion) 04/08/2022   Hyperlipidemia 04/08/2022   Depression screening 04/08/2022   Generalized obesity 04/08/2022   Prediabetes 06/17/2021   Low vitamin B12 level 06/17/2021   Hashimoto's disease 02/18/2021   Bilateral plantar fasciitis 09/26/2015   Vitamin D deficiency 08/08/2015   Myalgia and myositis 04/04/2015   Neck swelling 04/01/2015   Rosacea 01/12/2014   Anxiety and depression 05/18/2013   Screening for malignant neoplasm of cervix 10/13/2011   Routine general medical examination at a health care facility 10/13/2011   Hypothyroidism 08/22/2006    PCP: Sheliah Hatch, MD  REFERRING PROVIDER: Rodolph Bong, MD  REFERRING DIAG: M25.561,M25.562,G89.29 (ICD-10-CM) - Chronic pain of both knees   THERAPY DIAG:  Muscle weakness (generalized)  Acute pain of both knees  Difficulty navigating stairs  Rationale for  Evaluation and Treatment: Rehabilitation  ONSET DATE: 01/01/23  SUBJECTIVE:   SUBJECTIVE STATEMENT: I quickly realized I was not using my muscles, I am still sore but no pain.   PERTINENT HISTORY: Per referring physician note:  52 y.o. female who presents to [facility] at [facility] today for bilat knee pain x a couple years progressively getting worse with being more active with the weight loss clinic. Started getting really painful about a month ago on her walks. Patient states that she moved three years ago to a two story and the stairs hurt a lot. Pt locates pain to front of patella. Patient states that she does not have pain if she is sitting or laying only on impact and now on walks especially up hill. Walks wears brooks and her other shoes she weras her berkinstocks. These are the shoes that have helped her a lot with her plantar faucitis.  Assessment and Plan: 53 y.o. 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  She is a good candidate for physical therapy.  Plan to refer to PT.  Check back in 8 weeks.  Consider steroid injections if needed in the interim.  Recommend also Voltaren gel.  PAIN:  Are you having pain? Yes: NPRS scale: 0/10 Pain location: B knees Pain description:  sharp in patella Aggravating factors: climbing steps or hills, certain workouts Relieving factors: nothing  PRECAUTIONS: None  RED FLAGS: None   WEIGHT BEARING RESTRICTIONS: No  FALLS:  Has patient fallen in last 6 months? No  LIVING ENVIRONMENT: Lives with: lives with their family Lives in: House/apartment Stairs: Yes: Internal: 15 steps; on right going up and External: 4 steps; on right going up  OCCUPATION: Desk job   PLOF: Independent  PATIENT GOALS: strengthening and be able to do stairs without pain   NEXT MD VISIT: about 6 weeks  OBJECTIVE:   DIAGNOSTIC FINDINGS:  X-ray images  bilateral knees obtained today personally and independently interpreted   Right knee: Mild patellofemoral DJD.  No acute fractures.   Left knee: Minimal patellofemoral DJD.  No acute fractures are visible  PATIENT SURVEYS:  FOTO 57  COGNITION: Overall cognitive status: Within functional limits for tasks assessed     SENSATION: WFL   MUSCLE LENGTH: Hamstrings: some tightness in BLE   POSTURE: No Significant postural limitations  PALPATION: No remarkable findings   LOWER EXTREMITY ROM: grossly WFL    LOWER EXTREMITY MMT: grossly 5/5, R knee ext 4+/5 with pain   FUNCTIONAL TESTS:  5 times sit to stand: 15.33s   TODAY'S TREATMENT:                                                                                                                              DATE:  02/05/23 Bike L3 x57mins  Calf stretch 30s  Leg ext 10# 2x10 HS curls 25# 2x10 Step ups 6" Lateral step ups 6"  STS with OHP 2x10 Lateral band walks green  Hip/ext 5# 2x10  Leg press 40# 2x10, SL 20# x10    02/02/23 Nustep L 5 LE only Leg Press 10 x feet 3 way 10 each 40# . SL 10 x each 20# Calf Raise on leg press 40# 2 sets 15 Resisted gait 4 way 5 x each 30# TM OFF push and pull 10 x BIL HS BIL 20# 15x, 15# 10 SL Knee ext 10# 10 x hold 3 sec. SL 2 sets 5 LAQ green tband 10x hold 3 sec- added to HEP BOSU step up fwd and laterally 10 x each   01/27/23 NuStep L5 x 5 min Supine strengthening exercises- PPT, progress into bridge, clamshells, x 10 Seated long kicks, knee flex against G tband x 10 Mini squats with 6# x 10 Standing shoulder ext, rows, ER, 2 x 10 G tband  01/15/23 Education    PATIENT EDUCATION:  Education details: POC Person educated: Patient Education method: Explanation Education comprehension: verbalized understanding  HOME EXERCISE PROGRAM: Access Code: [email or web address] URL: [email or web address] Date: 01/25/2023 Prepared by: Cassie Freer  Exercises - Side  Stepping with Resistance at Ankles  - 1 x daily - 7 x weekly - 2 sets - 10 reps - Sit to Stand  - 1 x daily - 7 x weekly - 2 sets -  10 reps - Step Up  - 1 x daily - 7 x weekly - 2 sets - 10 reps - Seated Knee Extension with Resistance  - 1 x daily - 7 x weekly - 2 sets - 10 reps - Seated Hamstring Stretch  - 1 x daily - 7 x weekly - 2 reps - 30 hold  ASSESSMENT:  CLINICAL IMPRESSION:  pt arrives feeling some better but soreness post first PT session. Progressed with knee and hip strengthening with cuing. Tolerated session well with no c/o pain.   OBJECTIVE IMPAIRMENTS: Abnormal gait, decreased activity tolerance, decreased balance, decreased coordination, decreased mobility, difficulty walking, decreased ROM, decreased strength, and pain.   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   REHAB POTENTIAL: Good  CLINICAL DECISION MAKING: Stable/uncomplicated  EVALUATION COMPLEXITY: Low   GOALS: Goals reviewed with patient? Yes  SHORT TERM GOALS: Target date: 03/01/23 I with initial HEP Baseline: Goal status: 02/02/23 MET  LONG TERM GOALS: Target date: 04/05/23  I with final HEP Baseline:  Goal status: INITIAL  2.  Patient will be able to go up and down a flight of stairs without pain Baseline: pain with stairs Goal status: INITIAL  3.  Patient will be able to walk on uneven surfaces without pain  Baseline: pain with uneven grounds  Goal status: INITIAL  4.  Patient will report overall pain levels <2/10 or better  Baseline: 5-6/10 Goal status: INITIAL  PLAN:  PT FREQUENCY: 1-2x/week  PT DURATION: 10 weeks  PLANNED INTERVENTIONS: Therapeutic exercises, Therapeutic activity, Neuromuscular re-education,  Balance training, Gait training, Patient/Family education, Self Care, Joint mobilization, Stair training, Dry Needling, Electrical stimulation, Cryotherapy, Moist heat, Vasopneumatic device, Ionotophoresis 4mg /ml Dexamethasone, and Manual therapy  PLAN FOR NEXT SESSION: knee strengthening    Patient Details  Name: Stephanie Lynch MRN: [id number] Date of Birth: 1971-06-02 Referring Provider:  Rodolph Bong, MD  Encounter Date: 02/05/2023   Cassie Freer, PT 02/05/2023, 10:11 AM  [facility] at [facility] [address] [place], [place], [address] Phone: [phone]   Fax:  [phone]

## 2023-02-05 ENCOUNTER — Ambulatory Visit: Payer: BC Managed Care – PPO | Attending: Family Medicine

## 2023-02-05 DIAGNOSIS — M25562 Pain in left knee: Secondary | ICD-10-CM | POA: Insufficient documentation

## 2023-02-05 DIAGNOSIS — M25561 Pain in right knee: Secondary | ICD-10-CM | POA: Insufficient documentation

## 2023-02-05 DIAGNOSIS — M6281 Muscle weakness (generalized): Secondary | ICD-10-CM | POA: Diagnosis not present

## 2023-02-05 DIAGNOSIS — Z789 Other specified health status: Secondary | ICD-10-CM | POA: Insufficient documentation

## 2023-02-08 ENCOUNTER — Encounter: Payer: Self-pay | Admitting: Physical Therapy

## 2023-02-08 ENCOUNTER — Ambulatory Visit: Payer: BC Managed Care – PPO | Admitting: Physical Therapy

## 2023-02-08 DIAGNOSIS — M25561 Pain in right knee: Secondary | ICD-10-CM

## 2023-02-08 DIAGNOSIS — M25562 Pain in left knee: Secondary | ICD-10-CM | POA: Diagnosis not present

## 2023-02-08 DIAGNOSIS — M6281 Muscle weakness (generalized): Secondary | ICD-10-CM

## 2023-02-08 DIAGNOSIS — Z789 Other specified health status: Secondary | ICD-10-CM | POA: Diagnosis not present

## 2023-02-08 NOTE — Therapy (Signed)
OUTPATIENT PHYSICAL THERAPY LOWER EXTREMITY    Patient Name: Stephanie Lynch MRN: [id number] DOB:May 30, 1971, 52 y.o., female Today's Date: 02/08/2023  END OF SESSION:  PT End of Session - 02/08/23 1720     Visit Number 5    Date for PT Re-Evaluation 04/05/23    PT Start Time 1717    PT Stop Time 1755    PT Time Calculation (min) 38 min    Activity Tolerance Patient tolerated treatment well    Behavior During Therapy WFL for tasks assessed/performed                Past Medical History:  Diagnosis Date   Allergy    Anxiety    B12 deficiency    Constipation    Hashimoto's disease    High blood pressure    High cholesterol    IBS (irritable bowel syndrome)    Joint pain    Knee pain    Pre-diabetes    Thyroid disease    Vitamin D deficiency    Past Surgical History:  Procedure Laterality Date   WRIST SURGERY     Patient Active Problem List   Diagnosis Date Noted   Chronic idiopathic constipation 10/13/2022   Pre-diabetes 08/25/2022   BMI 31.0-31.9,adult 07/28/2022   Insulin resistance 05/06/2022   Stress 05/06/2022   Other constipation 04/21/2022   Other fatigue 04/08/2022   SOBOE (shortness of breath on exertion) 04/08/2022   Hyperlipidemia 04/08/2022   Depression screening 04/08/2022   Generalized obesity 04/08/2022   Prediabetes 06/17/2021   Low vitamin B12 level 06/17/2021   Hashimoto's disease 02/18/2021   Bilateral plantar fasciitis 09/26/2015   Vitamin D deficiency 08/08/2015   Myalgia and myositis 04/04/2015   Neck swelling 04/01/2015   Rosacea 01/12/2014   Anxiety and depression 05/18/2013   Screening for malignant neoplasm of cervix 10/13/2011   Routine general medical examination at a health care facility 10/13/2011   Hypothyroidism 08/22/2006    PCP: Sheliah Hatch, MD  REFERRING PROVIDER: Rodolph Bong, MD  REFERRING DIAG: M25.561,M25.562,G89.29 (ICD-10-CM) - Chronic pain of both knees   THERAPY DIAG:  Muscle weakness  (generalized)  Acute pain of both knees  Difficulty navigating stairs  Rationale for Evaluation and Treatment: Rehabilitation  ONSET DATE: 01/01/23  SUBJECTIVE:   SUBJECTIVE STATEMENT: Patient reports that her knee pain is improving, not as consistent.  PERTINENT HISTORY: Per referring physician note:  52 y.o. female who presents to [facility] at [facility] today for bilat knee pain x a couple years progressively getting worse with being more active with the weight loss clinic. Started getting really painful about a month ago on her walks. Patient states that she moved three years ago to a two story and the stairs hurt a lot. Pt locates pain to front of patella. Patient states that she does not have pain if she is sitting or laying only on impact and now on walks especially up hill. Walks wears brooks and her other shoes she weras her berkinstocks. These are the shoes that have helped her a lot with her plantar faucitis.  Assessment and Plan: 52 y.o. 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  She is a good candidate for physical therapy.  Plan to refer to PT.  Check back in 8 weeks.  Consider steroid injections if needed in the interim.  Recommend also Voltaren gel.  PAIN:  Are you having pain? Yes: NPRS  scale: 0/10 Pain location: B knees Pain description: sharp in patella Aggravating factors: climbing steps or hills, certain workouts Relieving factors: nothing  PRECAUTIONS: None  RED FLAGS: None   WEIGHT BEARING RESTRICTIONS: No  FALLS:  Has patient fallen in last 6 months? No  LIVING ENVIRONMENT: Lives with: lives with their family Lives in: House/apartment Stairs: Yes: Internal: 15 steps; on right going up and External: 4 steps; on right going up  OCCUPATION: Desk job   PLOF: Independent  PATIENT GOALS: strengthening and be able to do stairs without pain    NEXT MD VISIT: about 6 weeks  OBJECTIVE:   DIAGNOSTIC FINDINGS:  X-ray images bilateral knees obtained today personally and independently interpreted   Right knee: Mild patellofemoral DJD.  No acute fractures.   Left knee: Minimal patellofemoral DJD.  No acute fractures are visible  PATIENT SURVEYS:  FOTO 57  COGNITION: Overall cognitive status: Within functional limits for tasks assessed     SENSATION: WFL   MUSCLE LENGTH: Hamstrings: some tightness in BLE   POSTURE: No Significant postural limitations  PALPATION: No remarkable findings   LOWER EXTREMITY ROM: grossly WFL    LOWER EXTREMITY MMT: grossly 5/5, R knee ext 4+/5 with pain   FUNCTIONAL TESTS:  5 times sit to stand: 15.33s   TODAY'S TREATMENT:                                                                                                                              DATE:  02/08/23 NuStep L5 x 6 minutes STS with 5# weight OHP, then mini squats with chest press x 10 HS curls 25#, 2 x 10 reps Knee ext 10# 2 x 10 reps Monster walking forward and back with G tband at knees 2 x 10 each direction. Heel raises on step, 2 x 10 reps with UE support for balance. Calf stretch and heel raises on step, 2 x 10 reps Anti rotation against 10#, 2 x 10 reps each side  02/05/23 Bike L3 x22mins  Calf stretch 30s  Leg ext 10# 2x10 HS curls 25# 2x10 Step ups 6" Lateral step ups 6"  STS with OHP 2x10 Lateral band walks green  Hip/ext 5# 2x10  Leg press 40# 2x10, SL 20# x10   02/02/23 Nustep L 5 LE only Leg Press 10 x feet 3 way 10 each 40# . SL 10 x each 20# Calf Raise on leg press 40# 2 sets 15 Resisted gait 4 way 5 x each 30# TM OFF push and pull 10 x BIL HS BIL 20# 15x, 15# 10 SL Knee ext 10# 10 x hold 3 sec. SL 2 sets 5 LAQ green tband 10x hold 3 sec- added to HEP BOSU step up fwd and laterally 10 x each  01/27/23 NuStep L5 x 5 min Supine strengthening exercises- PPT, progress into bridge,  clamshells, x 10 Seated long kicks, knee flex against G tband x 10 Mini squats with 6#  x 10 Standing shoulder ext, rows, ER, 2 x 10 G tband  01/15/23 Education    PATIENT EDUCATION:  Education details: POC Person educated: Patient Education method: Explanation Education comprehension: verbalized understanding  HOME EXERCISE PROGRAM: Access Code: [email or web address] URL: [email or web address] Date: 01/25/2023 Prepared by: Cassie Freer  Exercises - Side Stepping with Resistance at Ankles  - 1 x daily - 7 x weekly - 2 sets - 10 reps - Sit to Stand  - 1 x daily - 7 x weekly - 2 sets - 10 reps - Step Up  - 1 x daily - 7 x weekly - 2 sets - 10 reps - Seated Knee Extension with Resistance  - 1 x daily - 7 x weekly - 2 sets - 10 reps - Seated Hamstring Stretch  - 1 x daily - 7 x weekly - 2 reps - 30 hold  ASSESSMENT:  CLINICAL IMPRESSION:  Patient reports she was not as sore after her last treatment. Feels her knee pain is improving. Progressed her strengthening and added some Proprioceptive education. She reported no knee pain with ext today.  OBJECTIVE IMPAIRMENTS: Abnormal gait, decreased activity tolerance, decreased balance, decreased coordination, decreased mobility, difficulty walking, decreased ROM, decreased strength, and pain.   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   REHAB POTENTIAL: Good  CLINICAL DECISION MAKING: Stable/uncomplicated  EVALUATION COMPLEXITY: Low   GOALS: Goals reviewed with patient? Yes  SHORT TERM GOALS: Target date: 03/01/23 I with initial HEP Baseline: Goal status: 02/02/23 MET  LONG TERM GOALS: Target date: 04/05/23  I with final HEP Baseline:  Goal status:  INITIAL  2.  Patient will be able to go up and down a flight of stairs without pain Baseline: pain with stairs Goal status: INITIAL  3.  Patient will be able to walk on uneven surfaces without pain  Baseline: pain with uneven grounds  Goal status: INITIAL  4.  Patient will report overall pain levels <2/10 or better  Baseline: 5-6/10 Goal status: INITIAL  PLAN:  PT FREQUENCY: 1-2x/week  PT DURATION: 10 weeks  PLANNED INTERVENTIONS: Therapeutic exercises, Therapeutic activity, Neuromuscular re-education, Balance training, Gait training, Patient/Family education, Self Care, Joint mobilization, Stair training, Dry Needling, Electrical stimulation, Cryotherapy, Moist heat, Vasopneumatic device, Ionotophoresis 4mg /ml Dexamethasone, and Manual therapy  PLAN FOR NEXT SESSION: knee strengthening    Patient Details  Name: LACHLAN BROLL MRN: [id number] Date of Birth: 11-Oct-1970 Referring Provider:  Rodolph Bong, MD  Encounter Date: 02/08/2023  Iona Beard, DPT 02/08/2023, 5:54 PM  [facility] [address] [place], [place], [address] Phone: [phone]   Fax:  [phone]

## 2023-02-11 NOTE — Therapy (Signed)
OUTPATIENT PHYSICAL THERAPY LOWER EXTREMITY    Patient Name: Stephanie Lynch MRN: [id number] DOB:1971-02-29, 52 y.o., female Today's Date: 02/12/2023  END OF SESSION:  PT End of Session - 02/12/23 0934     Visit Number 6    Date for PT Re-Evaluation 04/05/23    PT Start Time 0934    PT Stop Time 1015    PT Time Calculation (min) 41 min    Activity Tolerance Patient tolerated treatment well    Behavior During Therapy WFL for tasks assessed/performed                 Past Medical History:  Diagnosis Date   Allergy    Anxiety    B12 deficiency    Constipation    Hashimoto's disease    High blood pressure    High cholesterol    IBS (irritable bowel syndrome)    Joint pain    Knee pain    Pre-diabetes    Thyroid disease    Vitamin D deficiency    Past Surgical History:  Procedure Laterality Date   WRIST SURGERY     Patient Active Problem List   Diagnosis Date Noted   Chronic idiopathic constipation 10/13/2022   Pre-diabetes 08/25/2022   BMI 31.0-31.9,adult 07/28/2022   Insulin resistance 05/06/2022   Stress 05/06/2022   Other constipation 04/21/2022   Other fatigue 04/08/2022   SOBOE (shortness of breath on exertion) 04/08/2022   Hyperlipidemia 04/08/2022   Depression screening 04/08/2022   Generalized obesity 04/08/2022   Prediabetes 06/17/2021   Low vitamin B12 level 06/17/2021   Hashimoto's disease 02/18/2021   Bilateral plantar fasciitis 09/26/2015   Vitamin D deficiency 08/08/2015   Myalgia and myositis 04/04/2015   Neck swelling 04/01/2015   Rosacea 01/12/2014   Anxiety and depression 05/18/2013   Screening for malignant neoplasm of cervix 10/13/2011   Routine general medical examination at a health care facility 10/13/2011   Hypothyroidism 08/22/2006    PCP: Sheliah Hatch, MD  REFERRING PROVIDER: Rodolph Bong, MD  REFERRING DIAG: M25.561,M25.562,G89.29 (ICD-10-CM) - Chronic pain of both knees   THERAPY DIAG:  Muscle weakness  (generalized)  Acute pain of both knees  Difficulty navigating stairs  Rationale for Evaluation and Treatment: Rehabilitation  ONSET DATE: 01/01/23  SUBJECTIVE:   SUBJECTIVE STATEMENT: Going downstairs is less painful. Overall there has been improvements with pain.   PERTINENT HISTORY: Per referring physician note:  52 y.o. female who presents to [facility] at [facility] today for bilat knee pain x a couple years progressively getting worse with being more active with the weight loss clinic. Started getting really painful about a month ago on her walks. Patient states that she moved three years ago to a two story and the stairs hurt a lot. Pt locates pain to front of patella. Patient states that she does not have pain if she is sitting or laying only on impact and now on walks especially up hill. Walks wears brooks and her other shoes she weras her berkinstocks. These are the shoes that have helped her a lot with her plantar faucitis.  Assessment and Plan: 51 y.o. 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  She is a good candidate for physical therapy.  Plan to refer to PT.  Check back in 8 weeks.  Consider steroid injections if needed in the interim.  Recommend also Voltaren gel.  PAIN:  Are you having  pain? Yes: NPRS scale: 0/10 Pain location: B knees Pain description: sharp in patella Aggravating factors: climbing steps or hills, certain workouts Relieving factors: nothing  PRECAUTIONS: None  RED FLAGS: None   WEIGHT BEARING RESTRICTIONS: No  FALLS:  Has patient fallen in last 6 months? No  LIVING ENVIRONMENT: Lives with: lives with their family Lives in: House/apartment Stairs: Yes: Internal: 15 steps; on right going up and External: 4 steps; on right going up  OCCUPATION: Desk job   PLOF: Independent  PATIENT GOALS: strengthening and be able to do stairs  without pain   NEXT MD VISIT: about 6 weeks  OBJECTIVE:   DIAGNOSTIC FINDINGS:  X-ray images bilateral knees obtained today personally and independently interpreted   Right knee: Mild patellofemoral DJD.  No acute fractures.   Left knee: Minimal patellofemoral DJD.  No acute fractures are visible  PATIENT SURVEYS:  FOTO 57  COGNITION: Overall cognitive status: Within functional limits for tasks assessed     SENSATION: WFL   MUSCLE LENGTH: Hamstrings: some tightness in BLE   POSTURE: No Significant postural limitations  PALPATION: No remarkable findings   LOWER EXTREMITY ROM: grossly WFL    LOWER EXTREMITY MMT: grossly 5/5, R knee ext 4+/5 with pain   FUNCTIONAL TESTS:  5 times sit to stand: 15.33s   TODAY'S TREATMENT:                                                                                                                              DATE:  02/12/23 Resisted gait 30# 4 way x4  Treadmill pushes 30s x2  Bike L4 x6 mins with power bursts x3 BOSU step ups x10  Mini squats on BOSU 2x10 STS with green band abd 2x10  Leg press 40# 2x10  HS curls 35#, 2x10 Knee ext 10# 2x10   02/08/23 NuStep L5 x 6 minutes STS with 5# weight OHP, then mini squats with chest press x 10 HS curls 25#, 2 x 10 reps Knee ext 10# 2 x 10 reps Monster walking forward and back with G tband at knees 2 x 10 each direction. Heel raises on step, 2 x 10 reps with UE support for balance. Calf stretch and heel raises on step, 2 x 10 reps Anti rotation against 10#, 2 x 10 reps each side  02/05/23 Bike L3 x91mins  Calf stretch 30s  Leg ext 10# 2x10 HS curls 25# 2x10 Step ups 6" Lateral step ups 6"  STS with OHP 2x10 Lateral band walks green  Hip/ext 5# 2x10  Leg press 40# 2x10, SL 20# x10   02/02/23 Nustep L 5 LE only Leg Press 10 x feet 3 way 10 each 40# . SL 10 x each 20# Calf Raise on leg press 40# 2 sets 15 Resisted gait 4 way 5 x each 30# TM OFF push and pull 10 x BIL HS  BIL 20# 15x, 15# 10 SL Knee ext 10# 10 x hold  3 sec. SL 2 sets 5 LAQ green tband 10x hold 3 sec- added to HEP BOSU step up fwd and laterally 10 x each  01/27/23 NuStep L5 x 5 min Supine strengthening exercises- PPT, progress into bridge, clamshells, x 10 Seated long kicks, knee flex against G tband x 10 Mini squats with 6# x 10 Standing shoulder ext, rows, ER, 2 x 10 G tband  01/15/23 Education    PATIENT EDUCATION:  Education details: POC Person educated: Patient Education method: Explanation Education comprehension: verbalized understanding  HOME EXERCISE PROGRAM: Access Code: [email or web address] URL: [email or web address] Date: 01/25/2023 Prepared by: Cassie Freer  Exercises - Side Stepping with Resistance at Ankles  - 1 x daily - 7 x weekly - 2 sets - 10 reps - Sit to Stand  - 1 x daily - 7 x weekly - 2 sets - 10 reps - Step Up  - 1 x daily - 7 x weekly - 2 sets - 10 reps - Seated Knee Extension with Resistance  - 1 x daily - 7 x weekly - 2 sets - 10 reps - Seated Hamstring Stretch  - 1 x daily - 7 x weekly - 2 reps - 30 hold  ASSESSMENT:  CLINICAL IMPRESSION:  Patient reports she has noticed some things are not as painful as they used to be. Going up stairs is still difficult, but going down has gotten easier.  Feels her knee pain is improving, still needs to work on Dance movement psychotherapist. Knees have audible crepitus with mini squats on BOSU.   OBJECTIVE IMPAIRMENTS: Abnormal gait, decreased activity tolerance, decreased balance, decreased coordination, decreased mobility, difficulty walking, decreased ROM, decreased strength, and pain.   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   REHAB POTENTIAL:  Good  CLINICAL DECISION MAKING: Stable/uncomplicated  EVALUATION COMPLEXITY: Low   GOALS: Goals reviewed with patient? Yes  SHORT TERM GOALS: Target date: 03/01/23 I with initial HEP Baseline: Goal status: 02/02/23 MET  LONG TERM GOALS: Target date: 04/05/23  I with final HEP Baseline:  Goal status: INITIAL  2.  Patient will be able to go up and down a flight of stairs without pain Baseline: pain with stairs Goal status: INITIAL  3.  Patient will be able to walk on uneven surfaces without pain  Baseline: pain with uneven grounds  Goal status: INITIAL  4.  Patient will report overall pain levels <2/10 or better  Baseline: 5-6/10 Goal status: INITIAL  PLAN:  PT FREQUENCY: 1-2x/week  PT DURATION: 10 weeks  PLANNED INTERVENTIONS: Therapeutic exercises, Therapeutic activity, Neuromuscular re-education, Balance training, Gait training, Patient/Family education, Self Care, Joint mobilization, Stair training, Dry Needling, Electrical stimulation, Cryotherapy, Moist heat, Vasopneumatic device, Ionotophoresis 4mg /ml Dexamethasone, and Manual therapy  PLAN FOR NEXT SESSION: knee strengthening    Patient Details  Name: JOLIYAH WOJNO MRN: [id number] Date of Birth: 06/27/1971 Referring Provider:  Rodolph Bong, MD  Encounter Date: 02/12/2023  Iona Beard, DPT 02/12/2023, 10:16 AM  [facility] at [facility] [address] [place], [place], [address] Phone: [phone]   Fax:  [phone]

## 2023-02-12 ENCOUNTER — Ambulatory Visit: Payer: BC Managed Care – PPO

## 2023-02-12 DIAGNOSIS — M25561 Pain in right knee: Secondary | ICD-10-CM | POA: Diagnosis not present

## 2023-02-12 DIAGNOSIS — M6281 Muscle weakness (generalized): Secondary | ICD-10-CM

## 2023-02-12 DIAGNOSIS — M25562 Pain in left knee: Secondary | ICD-10-CM | POA: Diagnosis not present

## 2023-02-12 DIAGNOSIS — Z789 Other specified health status: Secondary | ICD-10-CM

## 2023-02-15 ENCOUNTER — Ambulatory Visit: Payer: BC Managed Care – PPO | Admitting: Physical Therapy

## 2023-02-15 ENCOUNTER — Encounter: Payer: Self-pay | Admitting: Physical Therapy

## 2023-02-15 DIAGNOSIS — M25561 Pain in right knee: Secondary | ICD-10-CM

## 2023-02-15 DIAGNOSIS — Z789 Other specified health status: Secondary | ICD-10-CM | POA: Diagnosis not present

## 2023-02-15 DIAGNOSIS — M25562 Pain in left knee: Secondary | ICD-10-CM | POA: Diagnosis not present

## 2023-02-15 DIAGNOSIS — M6281 Muscle weakness (generalized): Secondary | ICD-10-CM | POA: Diagnosis not present

## 2023-02-15 NOTE — Therapy (Signed)
OUTPATIENT PHYSICAL THERAPY LOWER EXTREMITY    Patient Name: Stephanie Lynch MRN: [id number] DOB:05/05/1971, 52 y.o., female Today's Date: 02/15/2023  END OF SESSION:  PT End of Session - 02/15/23 1719     Visit Number 7    Date for PT Re-Evaluation 04/05/23    PT Start Time 1717    PT Stop Time 1755    PT Time Calculation (min) 38 min    Activity Tolerance Patient tolerated treatment well    Behavior During Therapy WFL for tasks assessed/performed                  Past Medical History:  Diagnosis Date   Allergy    Anxiety    B12 deficiency    Constipation    Hashimoto's disease    High blood pressure    High cholesterol    IBS (irritable bowel syndrome)    Joint pain    Knee pain    Pre-diabetes    Thyroid disease    Vitamin D deficiency    Past Surgical History:  Procedure Laterality Date   WRIST SURGERY     Patient Active Problem List   Diagnosis Date Noted   Chronic idiopathic constipation 10/13/2022   Pre-diabetes 08/25/2022   BMI 31.0-31.9,adult 07/28/2022   Insulin resistance 05/06/2022   Stress 05/06/2022   Other constipation 04/21/2022   Other fatigue 04/08/2022   SOBOE (shortness of breath on exertion) 04/08/2022   Hyperlipidemia 04/08/2022   Depression screening 04/08/2022   Generalized obesity 04/08/2022   Prediabetes 06/17/2021   Low vitamin B12 level 06/17/2021   Hashimoto's disease 02/18/2021   Bilateral plantar fasciitis 09/26/2015   Vitamin D deficiency 08/08/2015   Myalgia and myositis 04/04/2015   Neck swelling 04/01/2015   Rosacea 01/12/2014   Anxiety and depression 05/18/2013   Screening for malignant neoplasm of cervix 10/13/2011   Routine general medical examination at a health care facility 10/13/2011   Hypothyroidism 08/22/2006    PCP: Sheliah Hatch, MD  REFERRING PROVIDER: Rodolph Bong, MD  REFERRING DIAG: M25.561,M25.562,G89.29 (ICD-10-CM) - Chronic pain of both knees   THERAPY DIAG:  Muscle  weakness (generalized)  Acute pain of both knees  Difficulty navigating stairs  Rationale for Evaluation and Treatment: Rehabilitation  ONSET DATE: 01/01/23  SUBJECTIVE:   SUBJECTIVE STATEMENT: Going downstairs is less painful. Overall there has been improvements with pain.   PERTINENT HISTORY: Per referring physician note:  52 y.o. female who presents to [facility] at [facility] today for bilat knee pain x a couple years progressively getting worse with being more active with the weight loss clinic. Started getting really painful about a month ago on her walks. Patient states that she moved three years ago to a two story and the stairs hurt a lot. Pt locates pain to front of patella. Patient states that she does not have pain if she is sitting or laying only on impact and now on walks especially up hill. Walks wears brooks and her other shoes she weras her berkinstocks. These are the shoes that have helped her a lot with her plantar faucitis.  Assessment and Plan: 52 y.o. 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  She is a good candidate for physical therapy.  Plan to refer to PT.  Check back in 8 weeks.  Consider steroid injections if needed in the interim.  Recommend also Voltaren gel.  PAIN:  Are you  having pain? Yes: NPRS scale: 0/10 Pain location: B knees Pain description: sharp in patella Aggravating factors: climbing steps or hills, certain workouts Relieving factors: nothing  PRECAUTIONS: None  RED FLAGS: None   WEIGHT BEARING RESTRICTIONS: No  FALLS:  Has patient fallen in last 6 months? No  LIVING ENVIRONMENT: Lives with: lives with their family Lives in: House/apartment Stairs: Yes: Internal: 15 steps; on right going up and External: 4 steps; on right going up  OCCUPATION: Desk job   PLOF: Independent  PATIENT GOALS: strengthening and be able to do  stairs without pain   NEXT MD VISIT: about 6 weeks  OBJECTIVE:   DIAGNOSTIC FINDINGS:  X-ray images bilateral knees obtained today personally and independently interpreted   Right knee: Mild patellofemoral DJD.  No acute fractures.   Left knee: Minimal patellofemoral DJD.  No acute fractures are visible  PATIENT SURVEYS:  FOTO 57  COGNITION: Overall cognitive status: Within functional limits for tasks assessed     SENSATION: WFL   MUSCLE LENGTH: Hamstrings: some tightness in BLE   POSTURE: No Significant postural limitations  PALPATION: No remarkable findings   LOWER EXTREMITY ROM: grossly WFL    LOWER EXTREMITY MMT: grossly 5/5, R knee ext 4+/5 with pain   FUNCTIONAL TESTS:  5 times sit to stand: 15.33s   TODAY'S TREATMENT:                                                                                                                              DATE:  02/15/23 NuStep L5 x 6 minutes Squats to 8" target with 5# weight, 2 x 10 Dead lift to 4" target with 5# weight, 2 x 10 Ball on wall TKE, 2 x 10 each side Hip abduction with opposite leg on 4" step, 2 x 10 reps Heel raises on step, 2 x 10 BLE Heel taps off 4" step, 2 x 5 with each leg SLS on Airex pad, up to 10 seconds on L, 6 sec on R.  02/12/23 Resisted gait 30# 4 way x4  Treadmill pushes 30s x2  Bike L4 x6 mins with power bursts x3 BOSU step ups x10  Mini squats on BOSU 2x10 STS with green band abd 2x10  Leg press 40# 2x10  HS curls 35#, 2x10 Knee ext 10# 2x10  02/08/23 NuStep L5 x 6 minutes STS with 5# weight OHP, then mini squats with chest press x 10 HS curls 25#, 2 x 10 reps Knee ext 10# 2 x 10 reps Monster walking forward and back with G tband at knees 2 x 10 each direction. Heel raises on step, 2 x 10 reps with UE support for balance. Calf stretch and heel raises on step, 2 x 10 reps Anti rotation against 10#, 2 x 10 reps each side  02/05/23 Bike L3 x58mins  Calf stretch 30s  Leg ext 10#  2x10 HS curls 25# 2x10 Step ups 6" Lateral step ups 6"  STS  with OHP 2x10 Lateral band walks green  Hip/ext 5# 2x10  Leg press 40# 2x10, SL 20# x10   02/02/23 Nustep L 5 LE only Leg Press 10 x feet 3 way 10 each 40# . SL 10 x each 20# Calf Raise on leg press 40# 2 sets 15 Resisted gait 4 way 5 x each 30# TM OFF push and pull 10 x BIL HS BIL 20# 15x, 15# 10 SL Knee ext 10# 10 x hold 3 sec. SL 2 sets 5 LAQ green tband 10x hold 3 sec- added to HEP BOSU step up fwd and laterally 10 x each  01/27/23 NuStep L5 x 5 min Supine strengthening exercises- PPT, progress into bridge, clamshells, x 10 Seated long kicks, knee flex against G tband x 10 Mini squats with 6# x 10 Standing shoulder ext, rows, ER, 2 x 10 G tband  01/15/23 Education    PATIENT EDUCATION:  Education details: POC Person educated: Patient Education method: Explanation Education comprehension: verbalized understanding  HOME EXERCISE PROGRAM: Access Code: [email or web address] URL: [email or web address] Date: 01/25/2023 Prepared by: Cassie Freer  Exercises - Side Stepping with Resistance at Ankles  - 1 x daily - 7 x weekly - 2 sets - 10 reps - Sit to Stand  - 1 x daily - 7 x weekly - 2 sets - 10 reps - Step Up  - 1 x daily - 7 x weekly - 2 sets - 10 reps - Seated Knee Extension with Resistance  - 1 x daily - 7 x weekly - 2 sets - 10 reps - Seated Hamstring Stretch  - 1 x daily - 7 x weekly - 2 reps - 30 hold  ASSESSMENT:  CLINICAL IMPRESSION:  Patient demonstrates improved stability, L leg stronger than R. Progressed strength challenges today with no reports of knee pain, no cueing needed to maintain proper form.  OBJECTIVE IMPAIRMENTS: Abnormal gait, decreased activity tolerance, decreased balance, decreased coordination, decreased mobility, difficulty walking, decreased ROM, decreased strength, and pain.   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   REHAB POTENTIAL: Good  CLINICAL DECISION MAKING: Stable/uncomplicated  EVALUATION COMPLEXITY: Low   GOALS: Goals reviewed with patient? Yes  SHORT TERM GOALS: Target date: 03/01/23 I with initial HEP Baseline: Goal status: 02/02/23 MET  LONG TERM GOALS: Target date: 04/05/23  I with final HEP Baseline:  Goal status: INITIAL  2.  Patient will be able to go up and down a flight of stairs without pain Baseline: pain with stairs Goal status: 02/15/23- Patient reports improved ability to go down stairs, but still has some difficulty with going up, ongoing  3.  Patient will be able to walk on uneven surfaces without pain  Baseline: pain with uneven grounds  Goal status: INITIAL  4.  Patient will report overall pain levels <2/10 or better  Baseline: 5-6/10 Goal status: INITIAL  PLAN:  PT FREQUENCY: 1-2x/week  PT DURATION: 10 weeks  PLANNED INTERVENTIONS: Therapeutic exercises, Therapeutic activity, Neuromuscular re-education, Balance training, Gait training, Patient/Family education, Self Care, Joint mobilization, Stair training, Dry Needling, Electrical stimulation, Cryotherapy, Moist heat, Vasopneumatic device, Ionotophoresis 4mg /ml Dexamethasone, and Manual therapy  PLAN FOR NEXT SESSION: knee strengthening    Patient Details  Name: FYNLEIGH NOEL MRN: [id number] Date of Birth: 20-Dec-1970 Referring Provider:  Rodolph Bong, MD  Encounter Date:  02/15/2023  Iona Beard, DPT 02/15/2023, 5:56 PM  [facility]. [place], [place], [address] Phone: [phone]   Fax:  [phone]

## 2023-02-18 ENCOUNTER — Ambulatory Visit: Payer: BC Managed Care – PPO | Admitting: Physical Therapy

## 2023-02-19 ENCOUNTER — Ambulatory Visit: Payer: BC Managed Care – PPO

## 2023-02-22 ENCOUNTER — Ambulatory Visit: Payer: BC Managed Care – PPO

## 2023-02-22 DIAGNOSIS — M25562 Pain in left knee: Secondary | ICD-10-CM

## 2023-02-22 DIAGNOSIS — M6281 Muscle weakness (generalized): Secondary | ICD-10-CM | POA: Diagnosis not present

## 2023-02-22 DIAGNOSIS — Z789 Other specified health status: Secondary | ICD-10-CM

## 2023-02-22 DIAGNOSIS — M25561 Pain in right knee: Secondary | ICD-10-CM | POA: Diagnosis not present

## 2023-02-22 NOTE — Therapy (Signed)
OUTPATIENT PHYSICAL THERAPY LOWER EXTREMITY    Patient Name: Stephanie Lynch MRN: [id number] DOB:[birth year], 52 y.o., female Today's Date: 02/22/2023  END OF SESSION:  PT End of Session - 02/22/23 1702     Visit Number 8    Date for PT Re-Evaluation 04/05/23    PT Start Time 1700    PT Stop Time 1745    PT Time Calculation (min) 45 min    Activity Tolerance Patient tolerated treatment well    Behavior During Therapy WFL for tasks assessed/performed                   Past Medical History:  Diagnosis Date   Allergy    Anxiety    B12 deficiency    Constipation    Hashimoto's disease    High blood pressure    High cholesterol    IBS (irritable bowel syndrome)    Joint pain    Knee pain    Pre-diabetes    Thyroid disease    Vitamin D deficiency    Past Surgical History:  Procedure Laterality Date   WRIST SURGERY     Patient Active Problem List   Diagnosis Date Noted   Chronic idiopathic constipation 10/13/2022   Pre-diabetes 08/25/2022   BMI 31.0-31.9,adult 07/28/2022   Insulin resistance 05/06/2022   Stress 05/06/2022   Other constipation 04/21/2022   Other fatigue 04/08/2022   SOBOE (shortness of breath on exertion) 04/08/2022   Hyperlipidemia 04/08/2022   Depression screening 04/08/2022   Generalized obesity 04/08/2022   Prediabetes 06/17/2021   Low vitamin B12 level 06/17/2021   Hashimoto's disease 02/18/2021   Bilateral plantar fasciitis 09/26/2015   Vitamin D deficiency 08/08/2015   Myalgia and myositis 04/04/2015   Neck swelling 04/01/2015   Rosacea 01/12/2014   Anxiety and depression 05/18/2013   Screening for malignant neoplasm of cervix 10/13/2011   Routine general medical examination at a health care facility 10/13/2011   Hypothyroidism 08/22/2006    PCP: Sheliah Hatch, MD  REFERRING PROVIDER: Rodolph Bong, MD  REFERRING DIAG: M25.561,M25.562,G89.29 (ICD-10-CM) - Chronic pain of both knees   THERAPY DIAG:  Muscle  weakness (generalized)  Acute pain of both knees  Difficulty navigating stairs  Rationale for Evaluation and Treatment: Rehabilitation  ONSET DATE: 01/01/23  SUBJECTIVE:   SUBJECTIVE STATEMENT: It has been a day. Pain has been diminishing, I can tell it is getting better.   PERTINENT HISTORY: Per referring physician note:  52 y.o. female who presents to [facility] at [facility] today for bilat knee pain x a couple years progressively getting worse with being more active with the weight loss clinic. Started getting really painful about a month ago on her walks. Patient states that she moved three years ago to a two story and the stairs hurt a lot. Pt locates pain to front of patella. Patient states that she does not have pain if she is sitting or laying only on impact and now on walks especially up hill. Walks wears brooks and her other shoes she weras her berkinstocks. These are the shoes that have helped her a lot with her plantar faucitis.  Assessment and Plan: 52 y.o. 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  She is a good candidate for physical therapy.  Plan to refer to PT.  Check back in 8 weeks.  Consider steroid injections if needed in the interim.  Recommend also Voltaren gel.  PAIN:  Are you having pain? Yes: NPRS scale: 0/10 Pain location: B knees Pain description: sharp in patella Aggravating factors: climbing steps or hills, certain workouts Relieving factors: nothing  PRECAUTIONS: None  RED FLAGS: None   WEIGHT BEARING RESTRICTIONS: No  FALLS:  Has patient fallen in last 6 months? No  LIVING ENVIRONMENT: Lives with: lives with their family Lives in: House/apartment Stairs: Yes: Internal: 15 steps; on right going up and External: 4 steps; on right going up  OCCUPATION: Desk job   PLOF: Independent  PATIENT GOALS: strengthening and be able to do  stairs without pain   NEXT MD VISIT: about 6 weeks  OBJECTIVE:   DIAGNOSTIC FINDINGS:  X-ray images bilateral knees obtained today personally and independently interpreted   Right knee: Mild patellofemoral DJD.  No acute fractures.   Left knee: Minimal patellofemoral DJD.  No acute fractures are visible  PATIENT SURVEYS:  FOTO 57  COGNITION: Overall cognitive status: Within functional limits for tasks assessed     SENSATION: WFL   MUSCLE LENGTH: Hamstrings: some tightness in BLE   POSTURE: No Significant postural limitations  PALPATION: No remarkable findings   LOWER EXTREMITY ROM: grossly WFL    LOWER EXTREMITY MMT: grossly 5/5, R knee ext 4+/5 with pain   FUNCTIONAL TESTS:  5 times sit to stand: 15.33s   TODAY'S TREATMENT:                                                                                                                              DATE:  02/22/23 Elliptical L2 x2 mins  NuStep L5 x5 mins  Resisted side steps over obstacles 20# x5 each side  HS curls 35#, 2x10 Knee ext 10# 2x10 Calf raises 2x12  Calf stretch 30s on slant  Squats from 6" on top of treadmill 2x10 SLS on firm and then on foam 10s  SLS catch on firm and foam 5 catches at best  Leg press 40# 2x10    02/15/23 NuStep L5 x 6 minutes Squats to 8" target with 5# weight, 2 x 10 Dead lift to 4" target with 5# weight, 2 x 10 Ball on wall TKE, 2 x 10 each side Hip abduction with opposite leg on 4" step, 2 x 10 reps Heel raises on step, 2 x 10 BLE Heel taps off 4" step, 2 x 5 with each leg SLS on Airex pad, up to 10 seconds on L, 6 sec on R.  02/12/23 Resisted gait 30# 4 way x4  Treadmill pushes 30s x2  Bike L4 x6 mins with power bursts x3 BOSU step ups x10  Mini squats on BOSU 2x10 STS with green band abd 2x10  Leg press 40# 2x10  HS curls 35#, 2x10 Knee ext 10# 2x10  02/08/23 NuStep L5 x 6 minutes STS with 5# weight OHP, then mini squats with chest press x 10 HS curls 25#, 2  x 10 reps Knee ext 10# 2 x  10 reps Monster walking forward and back with G tband at knees 2 x 10 each direction. Heel raises on step, 2 x 10 reps with UE support for balance. Calf stretch and heel raises on step, 2 x 10 reps Anti rotation against 10#, 2 x 10 reps each side  02/05/23 Bike L3 x73mins  Calf stretch 30s  Leg ext 10# 2x10 HS curls 25# 2x10 Step ups 6" Lateral step ups 6"  STS with OHP 2x10 Lateral band walks green  Hip/ext 5# 2x10  Leg press 40# 2x10, SL 20# x10   02/02/23 Nustep L 5 LE only Leg Press 10 x feet 3 way 10 each 40# . SL 10 x each 20# Calf Raise on leg press 40# 2 sets 15 Resisted gait 4 way 5 x each 30# TM OFF push and pull 10 x BIL HS BIL 20# 15x, 15# 10 SL Knee ext 10# 10 x hold 3 sec. SL 2 sets 5 LAQ green tband 10x hold 3 sec- added to HEP BOSU step up fwd and laterally 10 x each  01/27/23 NuStep L5 x 5 min Supine strengthening exercises- PPT, progress into bridge, clamshells, x 10 Seated long kicks, knee flex against G tband x 10 Mini squats with 6# x 10 Standing shoulder ext, rows, ER, 2 x 10 G tband  01/15/23 Education    PATIENT EDUCATION:  Education details: POC Person educated: Patient Education method: Explanation Education comprehension: verbalized understanding  HOME EXERCISE PROGRAM: Access Code: [email or web address] URL: [email or web address] Date: 01/25/2023 Prepared by: Cassie Freer  Exercises - Side Stepping with Resistance at Ankles  - 1 x daily - 7 x weekly - 2 sets - 10 reps - Sit to Stand  - 1 x daily - 7 x weekly - 2 sets - 10 reps - Step Up  - 1 x daily - 7 x weekly - 2 sets - 10 reps - Seated Knee Extension with Resistance  - 1 x daily - 7 x weekly - 2 sets - 10 reps - Seated Hamstring Stretch  - 1 x daily - 7 x weekly - 2 reps - 30 hold  ASSESSMENT:  CLINICAL IMPRESSION:  Patient doing well overall, has noticed improvements. Still has some difficulty with stair navigation and thinks it may be because she  is hesitant due to knowing she has some decreased strength and stability. Progressed strength challenges today with no reports of knee pain, no cueing needed to maintain proper form. Knees were making noise with low squat but did not for other exercises that they normally did in other visits. Most difficulty with single leg balance today.   OBJECTIVE IMPAIRMENTS: Abnormal gait, decreased activity tolerance, decreased balance, decreased coordination, decreased mobility, difficulty walking, decreased ROM, decreased strength, and pain.   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   REHAB POTENTIAL: Good  CLINICAL DECISION MAKING: Stable/uncomplicated  EVALUATION COMPLEXITY: Low   GOALS: Goals reviewed with patient? Yes  SHORT TERM GOALS: Target date: 03/01/23 I with initial HEP Baseline: Goal status: 02/02/23 MET  LONG TERM GOALS: Target date: 04/05/23  I with final HEP Baseline:  Goal status: INITIAL  2.  Patient will be able to go up and down a flight of stairs without pain Baseline: pain with stairs Goal status: 02/15/23- Patient reports improved ability to go down stairs, but  still has some difficulty with going up, ongoing  3.  Patient will be able to walk on uneven surfaces without pain  Baseline: pain with uneven grounds  Goal status: INITIAL  4.  Patient will report overall pain levels <2/10 or better  Baseline: 5-6/10 Goal status: INITIAL  PLAN:  PT FREQUENCY: 1-2x/week  PT DURATION: 10 weeks  PLANNED INTERVENTIONS: Therapeutic exercises, Therapeutic activity, Neuromuscular re-education, Balance training, Gait training, Patient/Family education, Self Care, Joint mobilization, Stair training, Dry  Needling, Electrical stimulation, Cryotherapy, Moist heat, Vasopneumatic device, Ionotophoresis 4mg /ml Dexamethasone, and Manual therapy  PLAN FOR NEXT SESSION: knee strengthening    Patient Details  Name: Stephanie Lynch MRN: [id number] Date of Birth: [birth year] Referring Provider:  Rodolph Bong, MD  Encounter Date: 02/22/2023  Iona Beard, DPT 02/22/2023, 5:41 PM  [facility] [address] [facility] [place], [place], [address] Phone: [phone]   Fax:  [phone]

## 2023-02-23 ENCOUNTER — Ambulatory Visit (INDEPENDENT_AMBULATORY_CARE_PROVIDER_SITE_OTHER): Payer: BC Managed Care – PPO | Admitting: Family Medicine

## 2023-02-24 NOTE — Progress Notes (Unsigned)
   Rubin Payor, PhD, LAT, ATC acting as a scribe for Stephanie Graham, MD.  Stephanie Acre Erbes is a 52 y.o. female who presents to [facility] at [facility] today for f/u bilat knee pain. Pt was last seen by Dr. Denyse Amass on 01/01/23 and was advised to use Voltaren gel and was referred to PT, completing 8 visits.   Today, pt reports bilat knees are starting to feel a bit better. Improvement w/ "creaky-ness," less pain w/ stairs and increased strength.   Dx imaging: 01/01/23 R & L knee XR  Pertinent review of systems: No fevers or chills  Relevant historical information: B12 deficiency    Exam:  BP 132/84   Pulse 67   Ht 5\' 7"  (1.702 m)   Wt 206 lb (93.4 kg)   LMP  (LMP Unknown)   SpO2 99%   BMI 32.26 kg/m  General: Well Developed, well nourished, and in no acute distress.   MSK: Bilateral knees normal motion.    Lab and Radiology Results  Narrative & Impression  CLINICAL DATA:  Chronic bilateral knee pain   EXAM: LEFT KNEE 3 VIEWS; RIGHT KNEE 3 VIEWS   COMPARISON:  None Available.   FINDINGS: Right: No evidence of fracture, dislocation, or joint effusion. No evidence of arthropathy or other focal bone abnormality. Soft tissues are unremarkable.   Left: No evidence of fracture, dislocation, or joint effusion. No evidence of arthropathy or other focal bone abnormality. Soft tissues are unremarkable.   IMPRESSION: No acute fracture or significant degenerative changes.     Electronically Signed   By: Jacob Moores M.D.   On: 01/07/2023 16:16     I, Stephanie Lynch, personally (independently) visualized and performed the interpretation of the images attached in this note.      Assessment and Plan: 52 y.o. 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  She has done quite well with physical therapy.  She has her last PT scheduled for tomorrow which I think is a good idea to keep but  then finalize home exercise program.  Stressed the importance of continued home exercise program.  I am happy to perform steroid injections whenever she needs me to but right now she is doing too well to proceed with steroid injections.  Check back as needed.   PDMP not reviewed this encounter. No orders of the defined types were placed in this encounter.  No orders of the defined types were placed in this encounter.    Discussed warning signs or symptoms. Please see discharge instructions. Patient expresses understanding.   The above documentation has been reviewed and is accurate and complete Stephanie Lynch, M.D.

## 2023-02-25 ENCOUNTER — Ambulatory Visit: Payer: BC Managed Care – PPO | Admitting: Family Medicine

## 2023-02-25 VITALS — BP 132/84 | HR 67 | Ht 67.0 in | Wt 206.0 lb

## 2023-02-25 DIAGNOSIS — G8929 Other chronic pain: Secondary | ICD-10-CM | POA: Diagnosis not present

## 2023-02-25 DIAGNOSIS — M25562 Pain in left knee: Secondary | ICD-10-CM | POA: Diagnosis not present

## 2023-02-25 DIAGNOSIS — M25561 Pain in right knee: Secondary | ICD-10-CM | POA: Diagnosis not present

## 2023-02-25 NOTE — Patient Instructions (Addendum)
Thank you for coming in today.   I can do an injection any time.   Continue home exercises.   Recheck as needed.

## 2023-02-26 ENCOUNTER — Ambulatory Visit: Payer: BC Managed Care – PPO | Admitting: Physical Therapy

## 2023-02-26 ENCOUNTER — Encounter: Payer: Self-pay | Admitting: Physical Therapy

## 2023-02-26 DIAGNOSIS — M6281 Muscle weakness (generalized): Secondary | ICD-10-CM | POA: Diagnosis not present

## 2023-02-26 DIAGNOSIS — Z789 Other specified health status: Secondary | ICD-10-CM

## 2023-02-26 DIAGNOSIS — M25562 Pain in left knee: Secondary | ICD-10-CM | POA: Diagnosis not present

## 2023-02-26 DIAGNOSIS — M25561 Pain in right knee: Secondary | ICD-10-CM | POA: Diagnosis not present

## 2023-02-26 NOTE — Therapy (Signed)
OUTPATIENT PHYSICAL THERAPY LOWER EXTREMITY   PHYSICAL THERAPY DISCHARGE SUMMARY  Visits from Start of Care: 9  Current functional level related to goals / functional outcomes: Patient has progressed well and met goals   Remaining deficits: N/A   Education / Equipment: HEP   Patient agrees to discharge. Patient goals were met. Patient is being discharged due to being pleased with the current functional level.   Patient Name: Stephanie Lynch MRN: [id number] DOB:1971/06/12, 52 y.o., female Today's Date: 02/26/2023  END OF SESSION:  PT End of Session - 02/26/23 0850     Visit Number 9    Date for PT Re-Evaluation 04/05/23    PT Start Time 0848    PT Stop Time 0927    PT Time Calculation (min) 39 min    Activity Tolerance Patient tolerated treatment well    Behavior During Therapy WFL for tasks assessed/performed            Past Medical History:  Diagnosis Date   Allergy    Anxiety    B12 deficiency    Constipation    Hashimoto's disease    High blood pressure    High cholesterol    IBS (irritable bowel syndrome)    Joint pain    Knee pain    Pre-diabetes    Thyroid disease    Vitamin D deficiency    Past Surgical History:  Procedure Laterality Date   WRIST SURGERY     Patient Active Problem List   Diagnosis Date Noted   Chronic idiopathic constipation 10/13/2022   Pre-diabetes 08/25/2022   BMI 31.0-31.9,adult 07/28/2022   Insulin resistance 05/06/2022   Stress 05/06/2022   Other constipation 04/21/2022   Other fatigue 04/08/2022   SOBOE (shortness of breath on exertion) 04/08/2022   Hyperlipidemia 04/08/2022   Depression screening 04/08/2022   Generalized obesity 04/08/2022   Prediabetes 06/17/2021   Low vitamin B12 level 06/17/2021   Hashimoto's disease 02/18/2021   Bilateral plantar fasciitis 09/26/2015   Vitamin D deficiency 08/08/2015   Myalgia and myositis 04/04/2015   Neck swelling 04/01/2015   Rosacea 01/12/2014   Anxiety and  depression 05/18/2013   Screening for malignant neoplasm of cervix 10/13/2011   Routine general medical examination at a health care facility 10/13/2011   Hypothyroidism 08/22/2006    PCP: Sheliah Hatch, MD  REFERRING PROVIDER: Rodolph Bong, MD  REFERRING DIAG: M25.561,M25.562,G89.29 (ICD-10-CM) - Chronic pain of both knees   THERAPY DIAG:  Muscle weakness (generalized)  Acute pain of both knees  Difficulty navigating stairs  Rationale for Evaluation and Treatment: Rehabilitation  ONSET DATE: 01/01/23  SUBJECTIVE:   SUBJECTIVE STATEMENT: It has been a day. Pain has been diminishing, I can tell it is getting better.   PERTINENT HISTORY: Per referring physician note:  52 y.o. female who presents to [facility] at [facility] today for bilat knee pain x a couple years progressively getting worse with being more active with the weight loss clinic. Started getting really painful about a month ago on her walks. Patient states that she moved three years ago to a two story and the stairs hurt a lot. Pt locates pain to front of patella. Patient states that she does not have pain if she is sitting or laying only on impact and now on walks especially up hill. Walks wears brooks and her other shoes she weras her berkinstocks. These are the shoes that have helped her a lot with her plantar faucitis.  Assessment  and Plan: 52 y.o. 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  She is a good candidate for physical therapy.  Plan to refer to PT.  Check back in 8 weeks.  Consider steroid injections if needed in the interim.  Recommend also Voltaren gel.  PAIN:  Are you having pain? Yes: NPRS scale: 0/10 Pain location: B knees Pain description: sharp in patella Aggravating factors: climbing steps or hills, certain workouts Relieving factors: nothing  PRECAUTIONS: None  RED  FLAGS: None   WEIGHT BEARING RESTRICTIONS: No  FALLS:  Has patient fallen in last 6 months? No  LIVING ENVIRONMENT: Lives with: lives with their family Lives in: House/apartment Stairs: Yes: Internal: 15 steps; on right going up and External: 4 steps; on right going up  OCCUPATION: Desk job   PLOF: Independent  PATIENT GOALS: strengthening and be able to do stairs without pain   NEXT MD VISIT: about 6 weeks  OBJECTIVE:   DIAGNOSTIC FINDINGS:  X-ray images bilateral knees obtained today personally and independently interpreted   Right knee: Mild patellofemoral DJD.  No acute fractures.   Left knee: Minimal patellofemoral DJD.  No acute fractures are visible  PATIENT SURVEYS:  FOTO [id number]  COGNITION: Overall cognitive status: Within functional limits for tasks assessed     SENSATION: WFL   MUSCLE LENGTH: Hamstrings: some tightness in BLE   POSTURE: No Significant postural limitations  PALPATION: No remarkable findings   LOWER EXTREMITY ROM: grossly WFL    LOWER EXTREMITY MMT: grossly 5/5, R knee ext 4+/5 with pain   FUNCTIONAL TESTS:  5 times sit to stand: 15.33s   TODAY'S TREATMENT:                                                                                                                              DATE:  02/26/23 Elliptical L2 x 3 min forward and back Monster walks with G tband at knees, 10 forward and 10 back SLS moving to T position Step ups, side and crossover 3 way arm raises  02/22/23 Elliptical L2 x2 mins  NuStep L5 x5 mins  Resisted side steps over obstacles 20# x5 each side  HS curls 35#, 2x10 Knee ext 10# 2x10 Calf raises 2x12  Calf stretch 30s on slant  Squats from 6" on top of treadmill 2x10 SLS on firm and then on foam 10s  SLS catch on firm and foam 5 catches at best  Leg press 40# 2x10    02/15/23 NuStep L5 x 6 minutes Squats to 8" target with 5# weight, 2 x 10 Dead lift to 4" target with 5# weight, 2 x 10 Ball on wall  TKE, 2 x 10 each side Hip abduction with opposite leg on 4" step, 2 x 10 reps Heel raises on step, 2 x 10 BLE Heel taps off 4" step, 2 x 5 with each leg SLS on Airex pad, up  to 10 seconds on L, 6 sec on R.  02/12/23 Resisted gait 30# 4 way x4  Treadmill pushes 30s x2  Bike L4 x6 mins with power bursts x3 BOSU step ups x10  Mini squats on BOSU 2x10 STS with green band abd 2x10  Leg press 40# 2x10  HS curls 35#, 2x10 Knee ext 10# 2x10  02/08/23 NuStep L5 x 6 minutes STS with 5# weight OHP, then mini squats with chest press x 10 HS curls 25#, 2 x 10 reps Knee ext 10# 2 x 10 reps Monster walking forward and back with G tband at knees 2 x 10 each direction. Heel raises on step, 2 x 10 reps with UE support for balance. Calf stretch and heel raises on step, 2 x 10 reps Anti rotation against 10#, 2 x 10 reps each side  02/05/23 Bike L3 x7mins  Calf stretch 30s  Leg ext 10# 2x10 HS curls 25# 2x10 Step ups 6" Lateral step ups 6"  STS with OHP 2x10 Lateral band walks green  Hip/ext 5# 2x10  Leg press 40# 2x10, SL 20# x10   02/02/23 Nustep L 5 LE only Leg Press 10 x feet 3 way 10 each 40# . SL 10 x each 20# Calf Raise on leg press 40# 2 sets 15 Resisted gait 4 way 5 x each 30# TM OFF push and pull 10 x BIL HS BIL 20# 15x, 15# 10 SL Knee ext 10# 10 x hold 3 sec. SL 2 sets 5 LAQ green tband 10x hold 3 sec- added to HEP BOSU step up fwd and laterally 10 x each  01/27/23 NuStep L5 x 5 min Supine strengthening exercises- PPT, progress into bridge, clamshells, x 10 Seated long kicks, knee flex against G tband x 10 Mini squats with 6# x 10 Standing shoulder ext, rows, ER, 2 x 10 G tband  01/15/23 Education    PATIENT EDUCATION:  Education details: POC Person educated: Patient Education method: Explanation Education comprehension: verbalized understanding  HOME EXERCISE PROGRAM: Access Code: [email or web address] URL: [email or web address] Date: 01/25/2023 Prepared by:  Cassie Freer  Exercises - Side Stepping with Resistance at Ankles  - 1 x daily - 7 x weekly - 2 sets - 10 reps - Sit to Stand  - 1 x daily - 7 x weekly - 2 sets - 10 reps - Step Up  - 1 x daily - 7 x weekly - 2 sets - 10 reps - Seated Knee Extension with Resistance  - 1 x daily - 7 x weekly - 2 sets - 10 reps - Seated Hamstring Stretch  - 1 x daily - 7 x weekly - 2 reps - 30 hold  ASSESSMENT:  CLINICAL IMPRESSION:  Patient reports that the Dr was pleased with her progress, they feel she is ready to wrap up PT. Updated HEP and educated patient to commit to performing 3-4 of the exercises daily and rotating.  OBJECTIVE IMPAIRMENTS: Abnormal gait, decreased activity tolerance, decreased balance, decreased coordination, decreased mobility, difficulty walking, decreased ROM, decreased strength, and pain.   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   REHAB POTENTIAL: Good  CLINICAL DECISION MAKING: Stable/uncomplicated  EVALUATION COMPLEXITY: Low   GOALS: Goals reviewed with patient? Yes  SHORT TERM GOALS: Target date: 03/01/23 I with initial HEP Baseline: Goal status: 02/02/23 MET  LONG TERM GOALS: Target date: 04/05/23  I with final HEP Baseline:  Goal status: met  2.  Patient will be able to go up and down a flight of stairs without pain Baseline: pain with stairs Goal status: 02/15/23- Patient reports improved ability to go down stairs, but still has some difficulty with going up, ongoing, 02/26/23-met  3.  Patient will be able to walk on uneven surfaces without pain  Baseline: pain with uneven grounds  Goal status: 02/26/23-Per patient report, no pain, met.  4.  Patient will report overall pain levels <2/10  or better  Baseline: 5-6/10 Goal status: 02/26/23- Max of 4 at it worst, but she describes it as minimal.   PLAN:  PT FREQUENCY: 1-2x/week  PT DURATION: 10 weeks  PLANNED INTERVENTIONS: Therapeutic exercises, Therapeutic activity, Neuromuscular re-education, Balance training, Gait training, Patient/Family education, Self Care, Joint mobilization, Stair training, Dry Needling, Electrical stimulation, Cryotherapy, Moist heat, Vasopneumatic device, Ionotophoresis 4mg /ml Dexamethasone, and Manual therapy  PLAN FOR NEXT SESSION: knee strengthening    Patient Details  Name: Stephanie Lynch MRN: [id number] Date of Birth: 1971-05-31 Referring Provider:  Rodolph Bong, MD  Encounter Date: 02/26/2023  Iona Beard, DPT 02/26/2023, 9:28 AM  [facility] [address] [place], [place], [address] Phone: [phone]   Fax:  [phone]

## 2023-03-16 ENCOUNTER — Other Ambulatory Visit (INDEPENDENT_AMBULATORY_CARE_PROVIDER_SITE_OTHER): Payer: Self-pay | Admitting: Family Medicine

## 2023-03-16 ENCOUNTER — Encounter (INDEPENDENT_AMBULATORY_CARE_PROVIDER_SITE_OTHER): Payer: Self-pay | Admitting: Family Medicine

## 2023-03-16 ENCOUNTER — Ambulatory Visit (INDEPENDENT_AMBULATORY_CARE_PROVIDER_SITE_OTHER): Payer: BC Managed Care – PPO | Admitting: Family Medicine

## 2023-03-16 VITALS — BP 119/78 | HR 66 | Temp 97.9°F | Ht 67.0 in | Wt 201.0 lb

## 2023-03-16 DIAGNOSIS — E559 Vitamin D deficiency, unspecified: Secondary | ICD-10-CM

## 2023-03-16 DIAGNOSIS — E669 Obesity, unspecified: Secondary | ICD-10-CM | POA: Diagnosis not present

## 2023-03-16 DIAGNOSIS — Z6831 Body mass index (BMI) 31.0-31.9, adult: Secondary | ICD-10-CM

## 2023-03-16 DIAGNOSIS — R7303 Prediabetes: Secondary | ICD-10-CM | POA: Diagnosis not present

## 2023-03-16 DIAGNOSIS — G4709 Other insomnia: Secondary | ICD-10-CM | POA: Insufficient documentation

## 2023-03-16 MED ORDER — METFORMIN HCL 500 MG PO TABS
500.0000 mg | ORAL_TABLET | Freq: Two times a day (BID) | ORAL | 0 refills | Status: DC
Start: 2023-03-16 — End: 2023-05-11

## 2023-03-16 NOTE — Progress Notes (Signed)
.smr  Office: [phone]  /  Fax: [phone]  WEIGHT SUMMARY AND BIOMETRICS  Anthropometric Measurements Height: 5\' 7"  (1.702 m) Weight: 201 lb (91.2 kg) BMI (Calculated): 31.47 Weight at Last Visit: 203 lb Weight Lost Since Last Visit: 2 lb Weight Gained Since Last Visit: 0 Starting Weight: 204 lb Total Weight Loss (lbs): 3 lb (1.361 kg)   Body Composition  Body Fat %: 38.7 % Fat Mass (lbs): 78 lbs Muscle Mass (lbs): 117.4 lbs Total Body Water (lbs): 72 lbs Visceral Fat Rating : 9   Other Clinical Data Fasting: Yes Labs: No Today's Visit #: 12 Starting Date: 04/08/22    Chief Complaint: OBESITY  History of Present Illness   The patient, with a history of obesity, prediabetes, and Hashimoto's thyroiditis, presents for a follow-up visit. She reports a weight loss of two pounds over the past two months, attributing this to adherence to a category two eating plan approximately 60-65% of the time. She has also increased her physical activity, incorporating walking approximately twice a week.  The patient is currently on metformin for prediabetes management and reports no issues with the medication. 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  She reports a struggle with portion control, identifying this as a significant challenge in her weight loss journey. She also notes a particular craving for chocolate, which she manages by choosing healthier options such as dark chocolate or chocolate-covered pretzels.  The patient has completed a course of physical therapy for knee issues related to arthritis, which she reports has significantly improved her knee function. She continues to do quad strengthening exercises as recommended by her physical therapist.          PHYSICAL EXAM:  Blood pressure 119/78, pulse 66, temperature 97.9 F (36.6 C), height 5\' 7"  (1.702 m), weight 201  lb (91.2 kg), SpO2 97%. Body mass index is 31.48 kg/m.  DIAGNOSTIC DATA REVIEWED:  BMET    Component Value Date/Time   NA 139 01/22/2023 0935   NA 142 10/13/2022 0911   K 4.3 01/22/2023 0935   CL 104 01/22/2023 0935   CO2 27 01/22/2023 0935   GLUCOSE 94 01/22/2023 0935   BUN 14 01/22/2023 0935   BUN 14 10/13/2022 0911   CREATININE 0.73 01/22/2023 0935   CALCIUM 9.4 01/22/2023 0935   Lab Results  Component Value Date   HGBA1C 5.9 (H) 01/22/2023   HGBA1C 5.8 08/22/2021   Lab Results  Component Value Date   INSULIN 15.0 10/13/2022   INSULIN 16.9 04/08/2022   Lab Results  Component Value Date   TSH 0.98 01/22/2023   CBC    Component Value Date/Time   WBC 3.8 01/22/2023 0935   RBC 4.52 01/22/2023 0935   HGB 14.1 01/22/2023 0935   HGB 14.7 04/08/2022 0826   HCT 43.0 01/22/2023 0935   HCT 44.0 04/08/2022 0826   PLT 258 01/22/2023 0935   PLT 259 04/08/2022 0826   MCV 95.1 01/22/2023 0935   MCV 94 04/08/2022 0826   MCH 31.2 01/22/2023 0935   MCHC 32.8 01/22/2023 0935   RDW 12.8 01/22/2023 0935   RDW 12.6 04/08/2022 0826   Iron Studies No results found for: "IRON", "TIBC", "FERRITIN", "IRONPCTSAT" Lipid Panel     Component Value Date/Time   CHOL 181 10/13/2022 0911   TRIG 61 10/13/2022 0911   HDL 76 10/13/2022 0911   CHOLHDL 3 08/22/2021 1054   VLDL  17.2 08/22/2021 1054   LDLCALC 93 10/13/2022 0911   LDLDIRECT 137.8 09/18/2010 1058   Hepatic Function Panel     Component Value Date/Time   PROT 7.0 01/22/2023 0935   PROT 7.1 10/13/2022 0911   ALBUMIN 4.4 10/13/2022 0911   AST 13 01/22/2023 0935   ALT 11 01/22/2023 0935   ALKPHOS 115 10/13/2022 0911   BILITOT 0.4 01/22/2023 0935   BILITOT 0.3 10/13/2022 0911   BILIDIR 0.1 01/22/2023 0935   IBILI 0.3 01/22/2023 0935      Component Value Date/Time   TSH 0.98 01/22/2023 0935   Nutritional Lab Results  Component Value Date   VD25OH 50 01/22/2023   VD25OH 49.4 10/13/2022   VD25OH 38.3 04/08/2022      Assessment and Plan    Obesity Slow but steady weight loss with diet and exercise. Struggles with portion control and evening chocolate cravings. -Continue current diet and exercise regimen. -Consider strategies for portion control and healthier chocolate options.  Prediabetes Managed with Metformin and lifestyle modifications. -Refill Metformin prescription. -Continue diet and exercise regimen.  Vitamin D deficiency Patient reports positive impact on overall wellbeing. -Continue current supplementation regimen.   insomnia, occasional -Consider adding Magnesium supplementation for potential benefits on insulin resistance and sleep quality.  Follow-up in 2 months.       I have personally spent 40 minutes total time today in preparation, patient care, and documentation for this visit, including the following: review of clinical lab tests; review of medical tests/procedures/services.    She was informed of the importance of frequent follow up visits to maximize her success with intensive lifestyle modifications for her multiple health conditions.    Quillian Quince, MD

## 2023-03-19 ENCOUNTER — Encounter: Payer: BC Managed Care – PPO | Admitting: Family Medicine

## 2023-03-26 ENCOUNTER — Ambulatory Visit (INDEPENDENT_AMBULATORY_CARE_PROVIDER_SITE_OTHER): Payer: BC Managed Care – PPO | Admitting: Family Medicine

## 2023-03-26 VITALS — BP 124/76 | HR 68 | Temp 98.0°F | Ht 67.5 in | Wt 205.0 lb

## 2023-03-26 DIAGNOSIS — Z0000 Encounter for general adult medical examination without abnormal findings: Secondary | ICD-10-CM

## 2023-03-26 DIAGNOSIS — E559 Vitamin D deficiency, unspecified: Secondary | ICD-10-CM | POA: Diagnosis not present

## 2023-03-26 DIAGNOSIS — R7989 Other specified abnormal findings of blood chemistry: Secondary | ICD-10-CM | POA: Diagnosis not present

## 2023-03-26 DIAGNOSIS — R7303 Prediabetes: Secondary | ICD-10-CM

## 2023-03-26 LAB — TSH: TSH: 0.45 u[IU]/mL (ref 0.35–5.50)

## 2023-03-26 LAB — BASIC METABOLIC PANEL
BUN: 14 mg/dL (ref 6–23)
CO2: 27 mEq/L (ref 19–32)
Calcium: 9.8 mg/dL (ref 8.4–10.5)
Chloride: 102 mEq/L (ref 96–112)
Creatinine, Ser: 0.8 mg/dL (ref 0.40–1.20)
GFR: 85.61 mL/min (ref >60.00)
Glucose, Bld: 93 mg/dL (ref 70–99)
Potassium: 4.1 mEq/L (ref 3.5–5.1)
Sodium: 138 mEq/L (ref 135–145)

## 2023-03-26 LAB — CBC WITH DIFFERENTIAL/PLATELET
Basophils Absolute: 0 10*3/uL (ref 0.0–0.1)
Basophils Relative: 1.1 % (ref 0.0–3.0)
Eosinophils Absolute: 0.1 10*3/uL (ref 0.0–0.7)
Eosinophils Relative: 1.1 % (ref 0.0–5.0)
HCT: 43.9 % (ref 36.0–46.0)
Hemoglobin: 14.3 g/dL (ref 12.0–15.0)
Lymphocytes Relative: 31.1 % (ref 12.0–46.0)
Lymphs Abs: 1.4 10*3/uL (ref 0.7–4.0)
MCHC: 32.5 g/dL (ref 30.0–36.0)
MCV: 94.3 fl (ref 78.0–100.0)
Monocytes Absolute: 0.4 10*3/uL (ref 0.1–1.0)
Monocytes Relative: 10.1 % (ref 3.0–12.0)
Neutro Abs: 2.5 10*3/uL (ref 1.4–7.7)
Neutrophils Relative %: 56.6 % (ref 43.0–77.0)
Platelets: 265 10*3/uL (ref 150.0–400.0)
RBC: 4.65 Mil/uL (ref 3.87–5.11)
RDW: 13.2 % (ref 11.5–15.5)
WBC: 4.4 10*3/uL (ref 4.0–10.5)

## 2023-03-26 LAB — HEPATIC FUNCTION PANEL
ALT: 12 U/L (ref 0–35)
AST: 16 U/L (ref 0–37)
Albumin: 4.6 g/dL (ref 3.5–5.2)
Alkaline Phosphatase: 88 U/L (ref 39–117)
Bilirubin, Direct: 0.1 mg/dL (ref 0.0–0.3)
Total Bilirubin: 0.5 mg/dL (ref 0.2–1.2)
Total Protein: 7.7 g/dL (ref 6.0–8.3)

## 2023-03-26 LAB — LIPID PANEL
Cholesterol: 198 mg/dL (ref 0–200)
HDL: 82.8 mg/dL (ref >39.00)
LDL Cholesterol: 98 mg/dL (ref 0–99)
NonHDL: 115.69
Total CHOL/HDL Ratio: 2
Triglycerides: 87 mg/dL (ref 0.0–149.0)
VLDL: 17.4 mg/dL (ref 0.0–40.0)

## 2023-03-26 LAB — HEMOGLOBIN A1C: Hgb A1c MFr Bld: 6 % (ref 4.6–6.5)

## 2023-03-26 LAB — VITAMIN B12: Vitamin B-12: 306 pg/mL (ref 211–911)

## 2023-03-26 LAB — VITAMIN D 25 HYDROXY (VIT D DEFICIENCY, FRACTURES): VITD: 64.4 ng/mL (ref 30.00–100.00)

## 2023-03-26 MED ORDER — LINACLOTIDE 145 MCG PO CAPS: 145.0000 ug | ORAL_CAPSULE | Freq: Every day | ORAL | 1 refills | Status: DC

## 2023-03-26 MED ORDER — LEVOTHYROXINE SODIUM 88 MCG PO TABS: 88.0000 ug | ORAL_TABLET | Freq: Every day | ORAL | 1 refills | Status: DC

## 2023-03-26 NOTE — Progress Notes (Signed)
Subjective:    Patient ID: Stephanie Lynch, female    DOB: 07/25/1970, 52 y.o.   MRN: [id number]  HPI CPE- UTD on pap, colonoscopy, Tdap.  Mammo scheduled for November  Patient Care Team    Relationship Specialty Notifications Start End  Sheliah Hatch, MD PCP - General Family Medicine  07/28/22   Olivia Mackie, MD Consulting Physician Obstetrics and Gynecology  07/27/22   Charna Elizabeth, MD Consulting Physician Gastroenterology  07/27/22     Health Maintenance  Topic Date Due   MAMMOGRAM  01/14/2023   INFLUENZA VACCINE  02/04/2023   Zoster Vaccines- Shingrix (1 of 2) 04/24/2023 (Originally 04/21/2022)   DTaP/Tdap/Td (2 - Td or Tdap) 09/30/2023   Cervical Cancer Screening (HPV/Pap Cotest)  11/03/2023   Colonoscopy  01/04/2031   HPV VACCINES  Aged Out   COVID-19 Vaccine  Discontinued   Hepatitis C Screening  Discontinued   HIV Screening  Discontinued      Review of Systems Patient reports no vision/ hearing changes, adenopathy,fever, weight change,  persistant/recurrent hoarseness , swallowing issues, chest pain, palpitations, edema, persistant/recurrent cough, hemoptysis, dyspnea (rest/exertional/paroxysmal nocturnal), gastrointestinal bleeding (melena, rectal bleeding), abdominal pain, significant heartburn, bowel changes, GU symptoms (dysuria, hematuria, incontinence), Gyn symptoms (abnormal  bleeding, pain),  syncope, focal weakness, memory loss, numbness & tingling, skin/hair/nail changes, abnormal bruising or bleeding, anxiety, or depression.     Objective:   Physical Exam General Appearance:    Alert, cooperative, no distress, appears stated age, obese  Head:    Normocephalic, without obvious abnormality, atraumatic  Eyes:    PERRL, conjunctiva/corneas clear, EOM's intact both eyes  Ears:    Normal TM's and external ear canals, both ears  Nose:   Nares normal, septum midline, mucosa normal, no drainage    or sinus tenderness  Throat:   Lips, mucosa, and tongue  normal; teeth and gums normal  Neck:   Supple, symmetrical, trachea midline, no adenopathy;    Thyroid: no enlargement/tenderness/nodules  Back:     Symmetric, no curvature, ROM normal, no CVA tenderness  Lungs:     Clear to auscultation bilaterally, respirations unlabored  Chest Wall:    No tenderness or deformity   Heart:    Regular rate and rhythm, S1 and S2 normal, no murmur, rub   or gallop  Breast Exam:    Deferred to GYN  Abdomen:     Soft, non-tender, bowel sounds active all four quadrants,    no masses, no organomegaly  Genitalia:    Deferred to GYN  Rectal:    Extremities:   Extremities normal, atraumatic, no cyanosis or edema  Pulses:   2+ and symmetric all extremities  Skin:   Skin color, texture, turgor normal, no rashes or lesions  Lymph nodes:   Cervical, supraclavicular, and axillary nodes normal  Neurologic:   CNII-XII intact, normal strength, sensation and reflexes    throughout          Assessment & Plan:

## 2023-03-26 NOTE — Assessment & Plan Note (Signed)
Pt's PE WNL w/ exception of BMI.  UTD on pap, colonoscopy, Tdap.  Mammo scheduled.  Check labs.  Anticipatory guidance provided.

## 2023-03-26 NOTE — Assessment & Plan Note (Signed)
Check labs and replete prn.

## 2023-03-26 NOTE — Patient Instructions (Signed)
Follow up in 6 months to recheck weight loss and insulin resistance We'll notify you of your lab results and make any changes if needed Continue to work on healthy diet and regular exercise- you can do it! Call with any questions or concerns Stay Safe!  Stay Healthy! Happy Early [place] Ouch!!!

## 2023-03-26 NOTE — Assessment & Plan Note (Signed)
Check labs and replete prn. 

## 2023-03-28 ENCOUNTER — Encounter: Payer: Self-pay | Admitting: Family Medicine

## 2023-04-02 ENCOUNTER — Encounter: Payer: Self-pay | Admitting: Family Medicine

## 2023-04-02 ENCOUNTER — Telehealth: Payer: Self-pay | Admitting: Family Medicine

## 2023-04-02 DIAGNOSIS — E063 Autoimmune thyroiditis: Secondary | ICD-10-CM

## 2023-04-02 NOTE — Telephone Encounter (Signed)
Sent to [facility] for advise

## 2023-04-02 NOTE — Telephone Encounter (Signed)
Correction: DOD should be DOS which means Date of Service, Sorry for any misunderstanding

## 2023-04-02 NOTE — Telephone Encounter (Signed)
Caller name: TALISSA APPLE  On DPR?: Yes  Call back number: [phone] (mobile)  Provider they see: Sheliah Hatch, MD  Reason for call:   DOD 01/20/23 She was billed incorrectly for a lab diagnosis. It needs to corrected and rebilled. She has already spoken to [facility] billing department.

## 2023-04-02 NOTE — Telephone Encounter (Signed)
Spoke with pt and she has not spoke with her insurance but will call and call us back to with DX code.

## 2023-04-06 NOTE — Telephone Encounter (Signed)
Called [facility] they state they do not have any rejection of claims to send to [place] for adjustment so the insurance has not sent this to them yet and they dont have anything they can send [place] I am not sure what to do

## 2023-04-07 NOTE — Telephone Encounter (Signed)
I spoke with Dawn regarding this patient's labs and her bill. Please call [facility] and make sure that we are giving them the correct date of service which is 01/22/2023. If they are needing new or different dx codes, please ask them to fax over the form for [place] to complete and fax back to them so that they can refile it for the patient.  Thank you!

## 2023-04-07 NOTE — Telephone Encounter (Signed)
[facility] wants verbal orders they are stating if we want a form we will have to wait for a bill to be sent via postal service. If we can call them back with additional dx codes then they can take those over the phone.   Notes the A1c, Vit D, Vit B12, and the Folic acid are not covered at this time and they want additional dx codes to increase the coverage   Can you tall me what dx to add

## 2023-04-08 MED ORDER — LEVOTHYROXINE SODIUM 88 MCG PO TABS
88.0000 ug | ORAL_TABLET | Freq: Every day | ORAL | 3 refills | Status: DC
Start: 2023-04-08 — End: 2024-02-28

## 2023-04-08 NOTE — Telephone Encounter (Signed)
Called and provided additional codes and she stated they will be submitted but they take 30-45 days to reprocess so we will not know that outcome until the

## 2023-04-08 NOTE — Telephone Encounter (Signed)
I'm not sure why the labs aren't covered b/c Vit D was ordered w/ Vit D deficiency, A1C was ordered w/ pre-diabetes, and I believe B12/folate was ordered w/ B12 deficiency.  All of those codes should be appropriate.

## 2023-04-09 NOTE — Telephone Encounter (Signed)
I also had the patient send a picture via my chart of her bill for reference. Please see patient message in her chart if you need it, we very well may need to contact her insurance company and ask why those CPT codes aren't covered.

## 2023-04-09 NOTE — Telephone Encounter (Signed)
Patient is aware of where we are in this process

## 2023-04-28 NOTE — Telephone Encounter (Signed)
Called patient to check in see if she has heard anything about the bill still being outstanding or id the additional dx codes are accepted. No answer LM will also attempt to call [facility] today time permitting

## 2023-05-04 NOTE — Telephone Encounter (Signed)
Called [facility] today and checked the status and they let me know if is still pending at this time but should not be too much longer as insurance has 30-45 days and the changes were submitted 04/08/23, called pt to let her know we were staying on top of this concern no answer from pt LM letting her know "We are still following up on the bill and will continue to provide updates"

## 2023-05-11 ENCOUNTER — Ambulatory Visit (INDEPENDENT_AMBULATORY_CARE_PROVIDER_SITE_OTHER): Payer: BC Managed Care – PPO | Admitting: Family Medicine

## 2023-05-11 ENCOUNTER — Encounter (INDEPENDENT_AMBULATORY_CARE_PROVIDER_SITE_OTHER): Payer: Self-pay | Admitting: Family Medicine

## 2023-05-11 VITALS — BP 113/55 | HR 83 | Temp 98.0°F | Ht 67.0 in | Wt 197.0 lb

## 2023-05-11 DIAGNOSIS — E669 Obesity, unspecified: Secondary | ICD-10-CM | POA: Diagnosis not present

## 2023-05-11 DIAGNOSIS — Z6830 Body mass index (BMI) 30.0-30.9, adult: Secondary | ICD-10-CM

## 2023-05-11 DIAGNOSIS — R7303 Prediabetes: Secondary | ICD-10-CM

## 2023-05-11 DIAGNOSIS — K5900 Constipation, unspecified: Secondary | ICD-10-CM

## 2023-05-11 MED ORDER — METFORMIN HCL 500 MG PO TABS
500.0000 mg | ORAL_TABLET | Freq: Two times a day (BID) | ORAL | 0 refills | Status: DC
Start: 2023-05-11 — End: 2023-07-13
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
## 2023-05-20 DIAGNOSIS — Z01419 Encounter for gynecological examination (general) (routine) without abnormal findings: Secondary | ICD-10-CM | POA: Diagnosis not present

## 2023-05-20 DIAGNOSIS — Z1331 Encounter for screening for depression: Secondary | ICD-10-CM | POA: Diagnosis not present

## 2023-05-20 DIAGNOSIS — Z1231 Encounter for screening mammogram for malignant neoplasm of breast: Secondary | ICD-10-CM | POA: Diagnosis not present

## 2023-05-20 DIAGNOSIS — Z124 Encounter for screening for malignant neoplasm of cervix: Secondary | ICD-10-CM | POA: Diagnosis not present

## 2023-05-20 LAB — HM MAMMOGRAPHY

## 2023-05-28 ENCOUNTER — Other Ambulatory Visit: Payer: Self-pay

## 2023-05-28 ENCOUNTER — Telehealth: Payer: Self-pay | Admitting: Family Medicine

## 2023-05-28 MED ORDER — LINACLOTIDE 145 MCG PO CAPS: 145.0000 ug | ORAL_CAPSULE | Freq: Every day | ORAL | 1 refills | Status: DC

## 2023-05-28 NOTE — Telephone Encounter (Signed)
Refilled last 03/26/2023 Last office visit 03/26/2023  Called pt and left vm RX has been sent in

## 2023-05-28 NOTE — Telephone Encounter (Signed)
Encourage patient to contact the pharmacy for refills or they can request refills through [facility]  (Please schedule appointment if patient has not been seen in over a year)    WHAT PHARMACY WOULD THEY LIKE THIS SENT TO: [id number] - [place], [place] - [address]   MEDICATION NAME & DOSE: linaclotide (LINZESS) 145 MCG CAPS capsule   NOTES/COMMENTS FROM PATIENT: Prescription Change - Linzess 145mg  increased to 290mg  Patient stated that since she started taking 2 tablets instead of 1, her supply is not going to last her 90 days. She was requesting a prescription reflecting the new dosage amount because she doesn't think her insurance will cover a refill that's being filled 45 days early.     Front office please notify patient: It takes 48-72 hours to process rx refill requests Ask patient to call pharmacy to ensure rx is ready before heading there.

## 2023-06-01 ENCOUNTER — Encounter: Payer: Self-pay | Admitting: Family Medicine

## 2023-06-02 MED ORDER — LINACLOTIDE 290 MCG PO CAPS: 290.0000 ug | ORAL_CAPSULE | Freq: Every day | ORAL | 1 refills | Status: DC

## 2023-06-02 NOTE — Addendum Note (Signed)
Addended by: Sheliah Hatch on: 06/02/2023 03:52 PM   Modules accepted: Orders

## 2023-06-11 DIAGNOSIS — N951 Menopausal and female climacteric states: Secondary | ICD-10-CM | POA: Diagnosis not present

## 2023-06-11 DIAGNOSIS — M8588 Other specified disorders of bone density and structure, other site: Secondary | ICD-10-CM | POA: Diagnosis not present

## 2023-06-11 DIAGNOSIS — M85852 Other specified disorders of bone density and structure, left thigh: Secondary | ICD-10-CM | POA: Diagnosis not present

## 2023-06-11 DIAGNOSIS — M8589 Other specified disorders of bone density and structure, multiple sites: Secondary | ICD-10-CM | POA: Diagnosis not present

## 2023-06-11 LAB — HM DEXA SCAN

## 2023-07-13 ENCOUNTER — Encounter (INDEPENDENT_AMBULATORY_CARE_PROVIDER_SITE_OTHER): Payer: Self-pay | Admitting: Family Medicine

## 2023-07-13 ENCOUNTER — Ambulatory Visit (INDEPENDENT_AMBULATORY_CARE_PROVIDER_SITE_OTHER): Payer: BC Managed Care – PPO | Admitting: Family Medicine

## 2023-07-13 VITALS — BP 130/73 | HR 85 | Temp 97.8°F | Ht 67.0 in | Wt 198.0 lb

## 2023-07-13 DIAGNOSIS — K5900 Constipation, unspecified: Secondary | ICD-10-CM

## 2023-07-13 DIAGNOSIS — F419 Anxiety disorder, unspecified: Secondary | ICD-10-CM

## 2023-07-13 DIAGNOSIS — E669 Obesity, unspecified: Secondary | ICD-10-CM

## 2023-07-13 DIAGNOSIS — E559 Vitamin D deficiency, unspecified: Secondary | ICD-10-CM

## 2023-07-13 DIAGNOSIS — R7303 Prediabetes: Secondary | ICD-10-CM

## 2023-07-13 DIAGNOSIS — Z6831 Body mass index (BMI) 31.0-31.9, adult: Secondary | ICD-10-CM

## 2023-07-13 DIAGNOSIS — K5909 Other constipation: Secondary | ICD-10-CM

## 2023-07-13 MED ORDER — ESCITALOPRAM OXALATE 10 MG PO TABS: 10.0000 mg | ORAL_TABLET | Freq: Every day | ORAL | 0 refills | Status: DC

## 2023-07-13 NOTE — Progress Notes (Signed)
 .smr  Office: [phone]  /  Fax: [phone]  WEIGHT SUMMARY AND BIOMETRICS  Anthropometric Measurements Height: 5' 7 (1.702 m) Weight: 198 lb (89.8 kg) BMI (Calculated): 31 Weight at Last Visit: 197 lb Weight Lost Since Last Visit: 0 Weight Gained Since Last Visit: 1 lb Starting Weight: 204 lb Total Weight Loss (lbs): 6 lb (2.722 kg)   Body Composition  Body Fat %: 38.3 % Fat Mass (lbs): 76 lbs Muscle Mass (lbs): 116.2 lbs Total Body Water (lbs): 73 lbs Visceral Fat Rating : 9   Other Clinical Data Fasting: Yes Labs: No Today's Visit #: 14 Starting Date: 04/08/22    Chief Complaint: OBESITY    History of Present Illness   The patient, diagnosed with prediabetes and obesity, reports a weight gain of one pound over the past two months. She has been attempting to manage her weight through portion control and making healthier food choices, albeit inconsistently. She admits to a lack of physical activity. She has been on metformin  for prediabetes management but reports gastrointestinal issues, including severe constipation and bloating, which she attributes to the medication. She has discontinued metformin  for about three to four weeks and noticed an improvement in her gastrointestinal symptoms.  The patient also reports a significant decrease in energy levels, which she attributes to her current sedentary lifestyle and possibly her discontinuation of metformin . She expresses a desire to incorporate more physical activity into her routine but struggles with finding an exercise regimen she enjoys and can consistently maintain. She has a treadmill at home and has previously benefited from low-impact strength training, particularly for her knees.  The patient also reports increased stress levels, which she believes may be affecting her overall health and weight management efforts. She expresses a sense of constant worry and anxiety, which has been more pronounced recently. She  has previously taken Wellbutrin for smoking cessation but reports constipation and weight gain as side effects. She is considering the use of supplements, particularly for her known vitamin B12 and D deficiencies, to improve her energy levels and overall health.  The patient has a history of osteopenia and weak muscle mass, which improved with physical therapy and strengthening exercises. However, she admits to not maintaining these exercises at home. She also reports a pattern of losing and regaining the same three pounds, which she finds frustrating. She expresses a desire to improve her gut health, believing it to be linked to her overall well-being. She has been taking magnesium supplements and has noticed some improvement in her constipation.          PHYSICAL EXAM:  Blood pressure 130/73, pulse 85, temperature 97.8 F (36.6 C), height 5' 7 (1.702 m), weight 198 lb (89.8 kg), SpO2 97%. Body mass index is 31.01 kg/m.  DIAGNOSTIC DATA REVIEWED:  BMET    Component Value Date/Time   NA 138 03/26/2023 1033   NA 142 10/13/2022 0911   K 4.1 03/26/2023 1033   CL 102 03/26/2023 1033   CO2 27 03/26/2023 1033   GLUCOSE 93 03/26/2023 1033   BUN 14 03/26/2023 1033   BUN 14 10/13/2022 0911   CREATININE 0.80 03/26/2023 1033   CREATININE 0.73 01/22/2023 0935   CALCIUM 9.8 03/26/2023 1033   Lab Results  Component Value Date   HGBA1C 6.0 03/26/2023   HGBA1C 5.8 08/22/2021   Lab Results  Component Value Date   INSULIN  15.0 10/13/2022   INSULIN  16.9 04/08/2022   Lab Results  Component Value Date   TSH 0.45  03/26/2023   CBC    Component Value Date/Time   WBC 4.4 03/26/2023 1033   RBC 4.65 03/26/2023 1033   HGB 14.3 03/26/2023 1033   HGB 14.7 04/08/2022 0826   HCT 43.9 03/26/2023 1033   HCT 44.0 04/08/2022 0826   PLT 265.0 03/26/2023 1033   PLT 259 04/08/2022 0826   MCV 94.3 03/26/2023 1033   MCV 94 04/08/2022 0826   MCH 31.2 01/22/2023 0935   MCHC 32.5 03/26/2023 1033    RDW 13.2 03/26/2023 1033   RDW 12.6 04/08/2022 0826   Iron Studies No results found for: IRON, TIBC, FERRITIN, IRONPCTSAT Lipid Panel     Component Value Date/Time   CHOL 198 03/26/2023 1033   CHOL 181 10/13/2022 0911   TRIG 87.0 03/26/2023 1033   HDL 82.80 03/26/2023 1033   HDL 76 10/13/2022 0911   CHOLHDL 2 03/26/2023 1033   VLDL 17.4 03/26/2023 1033   LDLCALC 98 03/26/2023 1033   LDLCALC 93 10/13/2022 0911   LDLDIRECT 137.8 09/18/2010 1058   Hepatic Function Panel     Component Value Date/Time   PROT 7.7 03/26/2023 1033   PROT 7.1 10/13/2022 0911   ALBUMIN 4.6 03/26/2023 1033   ALBUMIN 4.4 10/13/2022 0911   AST 16 03/26/2023 1033   ALT 12 03/26/2023 1033   ALKPHOS 88 03/26/2023 1033   BILITOT 0.5 03/26/2023 1033   BILITOT 0.3 10/13/2022 0911   BILIDIR 0.1 03/26/2023 1033   IBILI 0.3 01/22/2023 0935      Component Value Date/Time   TSH 0.45 03/26/2023 1033   Nutritional Lab Results  Component Value Date   VD25OH 64.40 03/26/2023   VD25OH 50 01/22/2023   VD25OH 49.4 10/13/2022     Assessment and Plan    Prediabetes Experiencing significant gastrointestinal side effects from metformin , including constipation and bloating. Off metformin  for 3-4 weeks with symptom improvement. Last A1c was 6.0 in September/October. Discussed risks and benefits of discontinuing metformin , including potential worsening of A1c. Prefers to manage prediabetes without metformin  due to side effects. - Discontinue metformin  - Order fasting labs in 4 weeks to reassess A1c  Constipation Severe constipation and bloating, likely exacerbated by metformin . Currently taking the highest dosage of Linzess  with some improvement. Discussed use of magnesium supplements, specifically Nature Vitality Sleep with melatonin and magnesium, to aid in constipation and muscle relaxation. - Continue Linzess  - Discuss use of Nature Vitality Sleep with melatonin and magnesium  Obesity Gained one  pound in the last two months. Working on portion control but not currently exercising. Has a treadmill but needs to be cautious due to knee issues. Expresses need for structured exercise and accountability. Discussed benefits of low-impact strength training and core exercises. Emphasized importance of regular physical activity and setting aside small amounts of time for exercise to avoid an all-or-nothing approach. - Encourage low-impact strength training and core exercises - Discuss importance of incorporating regular physical activity - Recommend setting aside small amounts of time for exercise  Anxiety Reports increased stress and anxiety, particularly with recent family events. Family history of anxiety. Previously used short-term Xanax. Experiencing excessive worry and difficulty controlling it. Discussed potential benefits of Lexapro , including reducing stress and improving emotional reserve. Explained that Lexapro  may take a few weeks to a month to show effects and does not typically cause weight gain. - Prescribe Lexapro , starting with a mid dose, to be taken in the morning - Discuss potential benefits of Lexapro  in reducing stress and improving emotional reserve - Reassess effectiveness  of Lexapro  in follow-up visit  Vitamin D  Deficiency Known vitamin D  deficiency. Currently taking daily over-the-counter supplements. Expresses low energy levels, particularly in winter. Discussed switching back to weekly prescription vitamin D  during winter months to potentially improve energy levels. - Switch back to weekly prescription vitamin D  - Reassess vitamin D  levels with upcoming labs  General Health Maintenance Working on portion control and making smart food choices. Discussed importance of regular hydration and taking vitamin B12 supplements regularly. - Encourage regular hydration - Recommend taking vitamin B12 over-the-counter supplements regularly  Follow-up - Schedule follow-up  appointment in 4 weeks with fasting labs.          She was informed of the importance of frequent follow up visits to maximize her success with intensive lifestyle modifications for her multiple health conditions.    Louann Penton, MD

## 2023-07-23 ENCOUNTER — Encounter: Payer: Self-pay | Admitting: Family Medicine

## 2023-07-23 NOTE — Telephone Encounter (Signed)
Patient requesting something more for her rosacea please advise

## 2023-07-30 ENCOUNTER — Other Ambulatory Visit: Payer: Self-pay

## 2023-07-30 ENCOUNTER — Ambulatory Visit: Payer: BC Managed Care – PPO | Admitting: Family Medicine

## 2023-07-30 VITALS — BP 142/88 | HR 81 | Ht 67.0 in | Wt 206.0 lb

## 2023-07-30 DIAGNOSIS — M25561 Pain in right knee: Secondary | ICD-10-CM

## 2023-07-30 DIAGNOSIS — G8929 Other chronic pain: Secondary | ICD-10-CM

## 2023-07-30 NOTE — Progress Notes (Signed)
   Rubin Payor, PhD, LAT, ATC acting as a scribe for Clementeen Graham, MD.  Stephanie Lynch is a 53 y.o. female who presents to [facility] today for exacerbation of her bilat knee pain. Pt was last seen by Dr. Denyse Amass on 02/25/23 and she was encouraged to cont working on her HEP.  Today, pt reports she had been doing well and working on HEP, but this last week they really started hurting her again. Especially the R knee.   Treatments tried: IBU, prior PT  Dx imaging: 01/01/23 R & L knee XR   Pertinent review of systems: no fever or chills  Relevant historical information: thyroid disease   Exam:  BP (!) 142/88   Pulse 81   Ht 5\' 7"  (1.702 m)   Wt 206 lb (93.4 kg)   LMP  (LMP Unknown)   SpO2 99%   BMI 32.26 kg/m  General: Well Developed, well nourished, and in no acute distress.   MSK: Right knee mild effusion normal motion with crepitation.  Tender palpation anterior knee.    Lab and Radiology Results  Procedure: Real-time Ultrasound Guided Injection of right knee joint superior lateral patella space Device: Philips Affiniti 50G/GE Logiq Images permanently stored and available for review in PACS Verbal informed consent obtained.  Discussed risks and benefits of procedure. Warned about infection, bleeding, hyperglycemia damage to structures among others. Patient expresses understanding and agreement Time-out conducted.   Noted no overlying erythema, induration, or other signs of local infection.   Skin prepped in a sterile fashion.   Local anesthesia: Topical Ethyl chloride.   With sterile technique and under real time ultrasound guidance: 40 mg of Kenalog and 2 mL of Marcaine injected into knee joint. Fluid seen entering the joint capsule.   Completed without difficulty   Pain immediately resolved suggesting accurate placement of the medication.   Advised to call if fevers/chills, erythema, induration, drainage, or persistent bleeding.    Images permanently stored and available for review in the ultrasound unit.  Impression: Technically successful ultrasound guided injection.       Assessment and Plan: 53 y.o. female with chronic right knee pain with an acute exacerbation.  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  Her pain has returned recently.  She notes that she stopped doing the exercises much.  Plan for steroid injection in the most painful right knee today and continued home exercise program.  I am hopeful that with less pain should be able to do the exercises better.  We could refer back to PT if needed with a phone call or MyChart message.   PDMP not reviewed this encounter. Orders Placed This Encounter  Procedures   [place] LIMITED JOINT SPACE STRUCTURES LOW BILAT(NO LINKED CHARGES)    Reason for Exam (SYMPTOM  OR DIAGNOSIS REQUIRED):   bilateral knee pain    Preferred imaging location?:    [facility]   No orders of the defined types were placed in this encounter.    Discussed warning signs or symptoms. Please see discharge instructions. Patient expresses understanding.   The above documentation has been reviewed and is accurate and complete Clementeen Graham, M.D.

## 2023-07-30 NOTE — Patient Instructions (Addendum)
Thank you for coming in today.   Call or go to the ER if you develop a large red swollen joint with extreme pain or oozing puss.    Resume home exercises.   I can reorder PT if needed. Let me know.

## 2023-08-11 ENCOUNTER — Ambulatory Visit (INDEPENDENT_AMBULATORY_CARE_PROVIDER_SITE_OTHER): Payer: BC Managed Care – PPO | Admitting: Family Medicine

## 2023-08-11 ENCOUNTER — Encounter (INDEPENDENT_AMBULATORY_CARE_PROVIDER_SITE_OTHER): Payer: Self-pay | Admitting: Family Medicine

## 2023-08-11 VITALS — BP 139/79 | HR 77 | Temp 97.6°F | Ht 67.0 in | Wt 200.0 lb

## 2023-08-11 DIAGNOSIS — R7989 Other specified abnormal findings of blood chemistry: Secondary | ICD-10-CM

## 2023-08-11 DIAGNOSIS — E7800 Pure hypercholesterolemia, unspecified: Secondary | ICD-10-CM

## 2023-08-11 DIAGNOSIS — R7303 Prediabetes: Secondary | ICD-10-CM | POA: Diagnosis not present

## 2023-08-11 DIAGNOSIS — E039 Hypothyroidism, unspecified: Secondary | ICD-10-CM

## 2023-08-11 DIAGNOSIS — E559 Vitamin D deficiency, unspecified: Secondary | ICD-10-CM | POA: Diagnosis not present

## 2023-08-11 DIAGNOSIS — Z6831 Body mass index (BMI) 31.0-31.9, adult: Secondary | ICD-10-CM

## 2023-08-11 DIAGNOSIS — E669 Obesity, unspecified: Secondary | ICD-10-CM

## 2023-08-11 DIAGNOSIS — Z87891 Personal history of nicotine dependence: Secondary | ICD-10-CM

## 2023-08-11 DIAGNOSIS — F419 Anxiety disorder, unspecified: Secondary | ICD-10-CM | POA: Diagnosis not present

## 2023-08-11 DIAGNOSIS — I10 Essential (primary) hypertension: Secondary | ICD-10-CM

## 2023-08-11 DIAGNOSIS — F5089 Other specified eating disorder: Secondary | ICD-10-CM | POA: Diagnosis not present

## 2023-08-11 DIAGNOSIS — M8580 Other specified disorders of bone density and structure, unspecified site: Secondary | ICD-10-CM

## 2023-08-11 DIAGNOSIS — E538 Deficiency of other specified B group vitamins: Secondary | ICD-10-CM

## 2023-08-11 NOTE — Progress Notes (Signed)
 .smr  Office: [phone]  /  Fax: [phone]  WEIGHT SUMMARY AND BIOMETRICS  Anthropometric Measurements Height: 5' 7 (1.702 m) Weight: 200 lb (90.7 kg) BMI (Calculated): 31.32 Weight at Last Visit: 198 lb Weight Lost Since Last Visit: 0 Weight Gained Since Last Visit: 2 lb Starting Weight: 204 lb Total Weight Loss (lbs): 8 lb (3.629 kg)   Body Composition  Body Fat %: 38.4 % Fat Mass (lbs): 76.8 lbs Muscle Mass (lbs): 117 lbs Total Body Water (lbs): 72.2 lbs Visceral Fat Rating : 9   Other Clinical Data Fasting: Yes Labs: Yes Today's Visit #: 15 Starting Date: 04/08/22    Chief Complaint: OBESITY    History of Present Illness   Stephanie Lynch is a 53 year old female who presents to discuss her weight management.  She has gained two pounds over the past month and is following the category two plan approximately 65% of the time. She engages in walking exercises for 30 to 45 minutes, three times a week. She has a history of emotional eating behaviors and has been prescribed Lexapro , which she has not yet started.  She discusses her stress levels, noting that work has calmed down, but she is dealing with the stress of her child's college decisions. She feels close to her younger daughter, who is deciding between two college options.  She is fasting for labs today and has a history of hypothyroidism, vitamin D  deficiency, and B12 deficiency. She is almost out of her thyroid  medication and is considering a full thyroid  panel to assess if any changes are needed.  She reports concerns about her blood pressure, noting that the systolic number has been elevated, around 139/79, for the past month or two. She does not check her blood pressure at home.  She received a cortisone shot for knee pain a couple of weeks ago, which has helped alleviate the pain. She is trying to increase her physical activity, including indoor walking, and has noticed improvement in her knee  pain.  She mentions a history of osteopenia, as indicated by a recent modicity scan, and is aware of the need for muscle strengthening exercises to improve her bone health. She is taking vitamin D  supplements and is working on increasing her protein intake.          PHYSICAL EXAM:  Blood pressure 139/79, pulse 77, temperature 97.6 F (36.4 C), height 5' 7 (1.702 m), weight 200 lb (90.7 kg), SpO2 100%. Body mass index is 31.32 kg/m.  DIAGNOSTIC DATA REVIEWED:  BMET    Component Value Date/Time   NA 138 03/26/2023 1033   NA 142 10/13/2022 0911   K 4.1 03/26/2023 1033   CL 102 03/26/2023 1033   CO2 27 03/26/2023 1033   GLUCOSE 93 03/26/2023 1033   BUN 14 03/26/2023 1033   BUN 14 10/13/2022 0911   CREATININE 0.80 03/26/2023 1033   CREATININE 0.73 01/22/2023 0935   CALCIUM 9.8 03/26/2023 1033   Lab Results  Component Value Date   HGBA1C 6.0 03/26/2023   HGBA1C 5.8 08/22/2021   Lab Results  Component Value Date   INSULIN  15.0 10/13/2022   INSULIN  16.9 04/08/2022   Lab Results  Component Value Date   TSH 0.45 03/26/2023   CBC    Component Value Date/Time   WBC 4.4 03/26/2023 1033   RBC 4.65 03/26/2023 1033   HGB 14.3 03/26/2023 1033   HGB 14.7 04/08/2022 0826   HCT 43.9 03/26/2023 1033   HCT 44.0 04/08/2022  0826   PLT 265.0 03/26/2023 1033   PLT 259 04/08/2022 0826   MCV 94.3 03/26/2023 1033   MCV 94 04/08/2022 0826   MCH 31.2 01/22/2023 0935   MCHC 32.5 03/26/2023 1033   RDW 13.2 03/26/2023 1033   RDW 12.6 04/08/2022 0826   Iron Studies No results found for: IRON, TIBC, FERRITIN, IRONPCTSAT Lipid Panel     Component Value Date/Time   CHOL 198 03/26/2023 1033   CHOL 181 10/13/2022 0911   TRIG 87.0 03/26/2023 1033   HDL 82.80 03/26/2023 1033   HDL 76 10/13/2022 0911   CHOLHDL 2 03/26/2023 1033   VLDL 17.4 03/26/2023 1033   LDLCALC 98 03/26/2023 1033   LDLCALC 93 10/13/2022 0911   LDLDIRECT 137.8 09/18/2010 1058   Hepatic Function Panel      Component Value Date/Time   PROT 7.7 03/26/2023 1033   PROT 7.1 10/13/2022 0911   ALBUMIN 4.6 03/26/2023 1033   ALBUMIN 4.4 10/13/2022 0911   AST 16 03/26/2023 1033   ALT 12 03/26/2023 1033   ALKPHOS 88 03/26/2023 1033   BILITOT 0.5 03/26/2023 1033   BILITOT 0.3 10/13/2022 0911   BILIDIR 0.1 03/26/2023 1033   IBILI 0.3 01/22/2023 0935      Component Value Date/Time   TSH 0.45 03/26/2023 1033   Nutritional Lab Results  Component Value Date   VD25OH 64.40 03/26/2023   VD25OH 50 01/22/2023   VD25OH 49.4 10/13/2022     Assessment and Plan    Obesity Gained two pounds in the last month. Following the category two plan about 65% of the time. Walking 30-45 minutes three times a week. Discussed the importance of core strengthening exercises, long-term lifestyle changes, and the genetic component of obesity (70%). - Encourage core strengthening exercises 30 minutes five times a week - Continue walking for exercise - Consider home-based exercises such as yoga and wall pilates - Follow up in 4-6 weeks  Hypertension Blood pressure recorded at 139/79 mmHg. Discussed the importance of home blood pressure monitoring to determine if it is consistently elevated. Patient reports stress at work and recent knee pain requiring a cortisone shot. - Encourage home blood pressure monitoring - Follow up in 4-6 weeks  Hypothyroidism Almost out of thyroid  medication. Requested a full thyroid  panel including T3 and TSH to assess if any changes are needed. Discussed the importance of fine-tuning thyroid  medication. - Order full thyroid  panel including T3 and TSH - Review lab results via MyChart and adjust medication if necessary  Anxiety with Emotional Eating Prescribed Lexapro  but has not yet started it. Discussed hesitation to start Lexapro  and the importance of addressing emotional eating. - Encourage considering starting Lexapro  as prescribed - Continue working on diet and  exercise  Osteopenia Confirmed by a recent scan. Discussed the importance of muscle strengthening exercises, vitamin D , and protein intake for bone health. - Encourage muscle strengthening exercises - Continue vitamin D  supplementation  Vitamin D  Deficiency Discussed the importance of continuing vitamin D  supplementation to support bone health. - Continue vitamin D  supplementation  Vitamin B12 Deficiency No specific discussion on current management or symptoms. - Check B12 levels with labs  General Health Maintenance Discussed the importance of diet, exercise, and weight loss. Emphasized protein intake and a balanced diet. - Encourage a balanced diet with adequate protein intake - Continue current supplements - Follow up in 4-6 weeks  Follow-up - Follow up in 4-6 weeks - Review lab results via MyChart and adjust treatment as necessary.  She was informed of the importance of frequent follow up visits to maximize her success with intensive lifestyle modifications for her multiple health conditions.    Louann Penton, MD

## 2023-08-12 LAB — CMP14+EGFR
ALT: 16 [IU]/L (ref 0–32)
AST: 18 [IU]/L (ref 0–40)
Albumin: 5.2 g/dL — ABNORMAL HIGH (ref 3.8–4.9)
Alkaline Phosphatase: 118 [IU]/L (ref 44–121)
BUN/Creatinine Ratio: 21 (ref 9–23)
BUN: 17 mg/dL (ref 6–24)
Bilirubin Total: 0.4 mg/dL (ref 0.0–1.2)
CO2: 22 mmol/L (ref 20–29)
Calcium: 10.1 mg/dL (ref 8.7–10.2)
Chloride: 100 mmol/L (ref 96–106)
Creatinine, Ser: 0.8 mg/dL (ref 0.57–1.00)
Globulin, Total: 2.6 g/dL (ref 1.5–4.5)
Glucose: 77 mg/dL (ref 70–99)
Potassium: 4.3 mmol/L (ref 3.5–5.2)
Sodium: 140 mmol/L (ref 134–144)
Total Protein: 7.8 g/dL (ref 6.0–8.5)
eGFR: 89 mL/min/{1.73_m2} (ref >59)

## 2023-08-12 LAB — HEMOGLOBIN A1C
Est. average glucose Bld gHb Est-mCnc: 126 mg/dL
Hgb A1c MFr Bld: 6 % — ABNORMAL HIGH (ref 4.8–5.6)

## 2023-08-12 LAB — LIPID PANEL WITH LDL/HDL RATIO
Cholesterol, Total: 236 mg/dL — ABNORMAL HIGH (ref 100–199)
HDL: 84 mg/dL (ref >39)
LDL Chol Calc (NIH): 141 mg/dL — ABNORMAL HIGH (ref 0–99)
LDL/HDL Ratio: 1.7 {ratio} (ref 0.0–3.2)
Triglycerides: 65 mg/dL (ref 0–149)
VLDL Cholesterol Cal: 11 mg/dL (ref 5–40)

## 2023-08-12 LAB — T3: T3, Total: 77 ng/dL (ref 71–180)

## 2023-08-12 LAB — INSULIN, RANDOM: INSULIN: 18.1 u[IU]/mL (ref 2.6–24.9)

## 2023-08-12 LAB — VITAMIN D 25 HYDROXY (VIT D DEFICIENCY, FRACTURES): Vit D, 25-Hydroxy: 58.7 ng/mL (ref 30.0–100.0)

## 2023-08-12 LAB — TSH: TSH: 1.49 u[IU]/mL (ref 0.450–4.500)

## 2023-08-12 LAB — T4, FREE: Free T4: 1.44 ng/dL (ref 0.82–1.77)

## 2023-08-12 LAB — VITAMIN B12: Vitamin B-12: 607 pg/mL (ref 232–1245)

## 2023-09-10 DIAGNOSIS — R490 Dysphonia: Secondary | ICD-10-CM | POA: Diagnosis not present

## 2023-09-13 ENCOUNTER — Encounter (INDEPENDENT_AMBULATORY_CARE_PROVIDER_SITE_OTHER): Payer: Self-pay | Admitting: Family Medicine

## 2023-09-13 ENCOUNTER — Ambulatory Visit (INDEPENDENT_AMBULATORY_CARE_PROVIDER_SITE_OTHER): Payer: BC Managed Care – PPO | Admitting: Family Medicine

## 2023-09-13 VITALS — BP 122/71 | HR 74 | Temp 98.4°F | Ht 67.0 in | Wt 196.0 lb

## 2023-09-13 DIAGNOSIS — I10 Essential (primary) hypertension: Secondary | ICD-10-CM | POA: Diagnosis not present

## 2023-09-13 DIAGNOSIS — F3289 Other specified depressive episodes: Secondary | ICD-10-CM

## 2023-09-13 DIAGNOSIS — E669 Obesity, unspecified: Secondary | ICD-10-CM | POA: Diagnosis not present

## 2023-09-13 DIAGNOSIS — F5089 Other specified eating disorder: Secondary | ICD-10-CM

## 2023-09-13 DIAGNOSIS — R7303 Prediabetes: Secondary | ICD-10-CM

## 2023-09-13 DIAGNOSIS — Z6830 Body mass index (BMI) 30.0-30.9, adult: Secondary | ICD-10-CM

## 2023-09-13 NOTE — Progress Notes (Signed)
 Office: [phone]  /  Fax: [phone]  WEIGHT SUMMARY AND BIOMETRICS  Anthropometric Measurements Height: 5\' 7"  (1.702 m) Weight: 196 lb (88.9 kg) BMI (Calculated): 30.69 Weight at Last Visit: 200 Weight Lost Since Last Visit: 4 lb Weight Gained Since Last Visit: 0 Starting Weight: 204 lb Total Weight Loss (lbs): 12 lb (5.443 kg)   Body Composition  Body Fat %: 38.6 % Fat Mass (lbs): 75.8 lbs Muscle Mass (lbs): 114.6 lbs Total Body Water (lbs): 72.8 lbs Visceral Fat Rating : 9   Other Clinical Data Fasting: no Labs: no Today's Visit #: 16 Starting Date: 04/08/22    Chief Complaint: OBESITY   Discussed the use of AI scribe software for clinical note transcription with the patient, who gave verbal consent to proceed.  History of Present Illness   The patient presents for obesity treatment plan discussion and progress monitoring. She is concerned about her daughter potentially getting sick due to her upcoming school musical.  She is following her category two eating plan approximately 60% of the time and exercises using her walking pad for 30 to 40 minutes three times per week. She has lost four pounds in the last month and is now below 200 pounds, with a BMI of 30.7.  She experienced a stomach bug last weekend, which contributed to her recent weight loss. She feels better now, although her appetite has not fully returned. Her husband also contracted the bug.  She has been monitoring her blood pressure and notes it was not satisfactory in the past months. She attributes improvements to her consistent exercise routine.  She has not taken Linzess since last weekend and is monitoring her symptoms to avoid bloating and overeating. She takes vitamin D and magnesium at night and has not had coffee in eight days, considering its potential impact on her stress and insulin resistance.  She discusses her breakfast routine, expressing difficulty in finding suitable options  that are quick and protein-rich. She mentions trying to avoid coffee before eating to manage her hormone levels better. She is exploring various breakfast options to maintain her dietary goals.  She has a history of emotional eating behaviors and is currently using behavior modification techniques to manage stress. She has decided to hold off on taking Lexapro.          PHYSICAL EXAM:  Blood pressure 122/71, pulse 74, temperature 98.4 F (36.9 C), height 5\' 7"  (1.702 m), weight 196 lb (88.9 kg), SpO2 98%. Body mass index is 30.7 kg/m.  DIAGNOSTIC DATA REVIEWED:  BMET    Component Value Date/Time   NA 140 08/11/2023 0928   K 4.3 08/11/2023 0928   CL 100 08/11/2023 0928   CO2 22 08/11/2023 0928   GLUCOSE 77 08/11/2023 0928   GLUCOSE 93 03/26/2023 1033   BUN 17 08/11/2023 0928   CREATININE 0.80 08/11/2023 0928   CREATININE 0.73 01/22/2023 0935   CALCIUM 10.1 08/11/2023 0928   Lab Results  Component Value Date   HGBA1C 6.0 (H) 08/11/2023   HGBA1C 5.8 08/22/2021   Lab Results  Component Value Date   INSULIN 18.1 08/11/2023   INSULIN 16.9 04/08/2022   Lab Results  Component Value Date   TSH 1.490 08/11/2023   CBC    Component Value Date/Time   WBC 4.4 03/26/2023 1033   RBC 4.65 03/26/2023 1033   HGB 14.3 03/26/2023 1033   HGB 14.7 04/08/2022 0826   HCT 43.9 03/26/2023 1033   HCT 44.0 04/08/2022 0826   PLT  265.0 03/26/2023 1033   PLT 259 04/08/2022 0826   MCV 94.3 03/26/2023 1033   MCV 94 04/08/2022 0826   MCH 31.2 01/22/2023 0935   MCHC 32.5 03/26/2023 1033   RDW 13.2 03/26/2023 1033   RDW 12.6 04/08/2022 0826   Iron Studies No results found for: "IRON", "TIBC", "FERRITIN", "IRONPCTSAT" Lipid Panel     Component Value Date/Time   CHOL 236 (H) 08/11/2023 0928   TRIG 65 08/11/2023 0928   HDL 84 08/11/2023 0928   CHOLHDL 2 03/26/2023 1033   VLDL 17.4 03/26/2023 1033   LDLCALC 141 (H) 08/11/2023 0928   LDLDIRECT 137.8 09/18/2010 1058   Hepatic Function  Panel     Component Value Date/Time   PROT 7.8 08/11/2023 0928   ALBUMIN 5.2 (H) 08/11/2023 0928   AST 18 08/11/2023 0928   ALT 16 08/11/2023 0928   ALKPHOS 118 08/11/2023 0928   BILITOT 0.4 08/11/2023 0928   BILIDIR 0.1 03/26/2023 1033   IBILI 0.3 01/22/2023 0935      Component Value Date/Time   TSH 1.490 08/11/2023 0928   Nutritional Lab Results  Component Value Date   VD25OH 58.7 08/11/2023   VD25OH 64.40 03/26/2023   VD25OH 50 01/22/2023     Assessment and Plan    Obesity and Emotional Eating Behaviors Following a category two eating plan 60% of the time and exercising with a walking pad for 30-40 minutes three times per week. Lost 4 pounds in the last month, now below 200 pounds with a BMI of 30.7. Discussed the importance of maintaining a balanced diet, regular exercise, protein intake, and strength training to prevent muscle loss, especially post-illness. Reviewed breakfast options for adequate protein intake and the impact of caffeine on weight and insulin resistance. She has chosen to not take her Lexapro prescribed to her - Continue category two eating plan - Continue exercising with walking pad - Incorporate strength training exercises - Increase protein intake - Consume a protein-rich breakfast before coffee -hold Lexapro for now  Prediabetes A1c stable, fasting glucose decreased to 77, indicating potential worsening of insulin resistance. Previously tried metformin but experienced worsened constipation with no significant change in A1c. Emphasized the importance of a balanced diet with controlled carb intake and regular exercise. - Continue monitoring A1c and fasting glucose - Maintain a balanced diet with controlled carb intake - Continue regular exercise  Hypertension Blood pressure improved with walking pad exercise. Monitoring stress levels and using behavior modification techniques instead of Lexapro. Discussed the importance of continued exercise and  stress management. - Continue using walking pad for exercise - Continue behavior modification techniques for stress management - Monitor blood pressure regularly   Follow-up - Schedule follow-up appointment in four weeks.         I have personally spent 40 minutes total time today in preparation, patient care, and documentation for this visit, including the following: review of clinical lab tests; review of medical tests/procedures/services.    She was informed of the importance of frequent follow up visits to maximize her success with intensive lifestyle modifications for her multiple health conditions.    Quillian Quince, MD

## 2023-10-11 ENCOUNTER — Encounter: Payer: Self-pay | Admitting: Family Medicine

## 2023-10-11 ENCOUNTER — Ambulatory Visit: Admitting: Family Medicine

## 2023-10-11 VITALS — BP 122/70 | HR 98 | Temp 98.0°F | Ht 67.0 in | Wt 202.1 lb

## 2023-10-11 DIAGNOSIS — H00033 Abscess of eyelid right eye, unspecified eyelid: Secondary | ICD-10-CM

## 2023-10-11 DIAGNOSIS — H00011 Hordeolum externum right upper eyelid: Secondary | ICD-10-CM

## 2023-10-11 MED ORDER — AMOXICILLIN 875 MG PO TABS
875.0000 mg | ORAL_TABLET | Freq: Two times a day (BID) | ORAL | 0 refills | Status: AC
Start: 2023-10-11 — End: 2023-10-18

## 2023-10-11 NOTE — Patient Instructions (Addendum)
 Follow up as needed or as scheduled Continue to apply hot compresses START the Amoxicillin twice daily- take w/ food Tylenol and ibuprofen as needed for pain Call with any questions or concerns Hang in there!!!

## 2023-10-11 NOTE — Progress Notes (Signed)
   Subjective:    Patient ID: Stephanie Lynch, female    DOB: August 21, 1970, 53 y.o.   MRN: [id number]  HPI R eye pain- pt reports she had a stye on lower lashes ~1 month ago.  Then starting a week ago developed small area on R upper lid that has enlarged and is very painful.  She admits she has been rubbing her eye.  Took Ibuprofen regularly yesterday.     Review of Systems For ROS see HPI     Objective:   Physical Exam Vitals reviewed.  Constitutional:      General: She is not in acute distress.    Appearance: She is not ill-appearing.  HENT:     Head: Normocephalic and atraumatic.  Eyes:     Extraocular Movements: Extraocular movements intact.     Conjunctiva/sclera: Conjunctivae normal.     Pupils: Pupils are equal, round, and reactive to light.     Comments: 1 cm stye on R upper lid at lash line, resolving stye on R lower lid, upper eyelid is erythematous, swollen, TTP  Musculoskeletal:     Cervical back: Neck supple.  Lymphadenopathy:     Cervical: No cervical adenopathy.  Skin:    General: Skin is warm and dry.     Findings: Erythema present.  Neurological:     General: No focal deficit present.     Mental Status: She is alert and oriented to person, place, and time.     Cranial Nerves: No cranial nerve deficit.     Motor: No weakness.  Psychiatric:        Mood and Affect: Mood normal.        Behavior: Behavior normal.        Thought Content: Thought content normal.           Assessment & Plan:  Stye w/ cellulitis of R upper lid- new.  Discussed supportive tx w/ warm compresses, avoiding rubbing the eye.  Since she is allergic to Doxy, discussed Keflex vs Amox vs Augmentin.  Pt would like to start w/ Amoxicillin.  Reviewed supportive care and red flags that should prompt return.  Pt expressed understanding and is in agreement w/ plan.

## 2023-10-19 ENCOUNTER — Ambulatory Visit (INDEPENDENT_AMBULATORY_CARE_PROVIDER_SITE_OTHER): Admitting: Family Medicine

## 2023-11-11 ENCOUNTER — Encounter: Payer: Self-pay | Admitting: Family Medicine

## 2023-11-16 ENCOUNTER — Ambulatory Visit (INDEPENDENT_AMBULATORY_CARE_PROVIDER_SITE_OTHER): Admitting: Family Medicine

## 2024-02-03 DIAGNOSIS — K5904 Chronic idiopathic constipation: Secondary | ICD-10-CM | POA: Diagnosis not present

## 2024-02-03 DIAGNOSIS — E669 Obesity, unspecified: Secondary | ICD-10-CM | POA: Diagnosis not present

## 2024-02-03 DIAGNOSIS — Z1211 Encounter for screening for malignant neoplasm of colon: Secondary | ICD-10-CM | POA: Diagnosis not present

## 2024-02-25 ENCOUNTER — Ambulatory Visit: Admitting: Family Medicine

## 2024-02-25 ENCOUNTER — Encounter: Payer: Self-pay | Admitting: Family Medicine

## 2024-02-25 VITALS — BP 130/72 | HR 84 | Temp 98.4°F | Resp 20 | Ht 67.0 in | Wt 208.0 lb

## 2024-02-25 DIAGNOSIS — Z6832 Body mass index (BMI) 32.0-32.9, adult: Secondary | ICD-10-CM | POA: Diagnosis not present

## 2024-02-25 DIAGNOSIS — E039 Hypothyroidism, unspecified: Secondary | ICD-10-CM

## 2024-02-25 DIAGNOSIS — E669 Obesity, unspecified: Secondary | ICD-10-CM

## 2024-02-25 DIAGNOSIS — E7800 Pure hypercholesterolemia, unspecified: Secondary | ICD-10-CM

## 2024-02-25 LAB — T4, FREE: Free T4: 0.93 ng/dL (ref 0.60–1.60)

## 2024-02-25 LAB — LIPID PANEL
Cholesterol: 185 mg/dL (ref 0–200)
HDL: 75.4 mg/dL (ref >39.00)
LDL Cholesterol: 92 mg/dL (ref 0–99)
NonHDL: 110.08
Total CHOL/HDL Ratio: 2
Triglycerides: 89 mg/dL (ref 0.0–149.0)
VLDL: 17.8 mg/dL (ref 0.0–40.0)

## 2024-02-25 LAB — BASIC METABOLIC PANEL WITH GFR
BUN: 13 mg/dL (ref 6–23)
CO2: 31 meq/L (ref 19–32)
Calcium: 10 mg/dL (ref 8.4–10.5)
Chloride: 103 meq/L (ref 96–112)
Creatinine, Ser: 0.75 mg/dL (ref 0.40–1.20)
GFR: 91.91 mL/min (ref >60.00)
Glucose, Bld: 87 mg/dL (ref 70–99)
Potassium: 4.7 meq/L (ref 3.5–5.1)
Sodium: 144 meq/L (ref 135–145)

## 2024-02-25 LAB — HEPATIC FUNCTION PANEL
ALT: 14 U/L (ref 0–35)
AST: 17 U/L (ref 0–37)
Albumin: 4.7 g/dL (ref 3.5–5.2)
Alkaline Phosphatase: 93 U/L (ref 39–117)
Bilirubin, Direct: 0.1 mg/dL (ref 0.0–0.3)
Total Bilirubin: 0.6 mg/dL (ref 0.2–1.2)
Total Protein: 7.4 g/dL (ref 6.0–8.3)

## 2024-02-25 LAB — CBC WITH DIFFERENTIAL/PLATELET
Basophils Absolute: 0 K/uL (ref 0.0–0.1)
Basophils Relative: 1.2 % (ref 0.0–3.0)
Eosinophils Absolute: 0 K/uL (ref 0.0–0.7)
Eosinophils Relative: 1 % (ref 0.0–5.0)
HCT: 43.8 % (ref 36.0–46.0)
Hemoglobin: 14.4 g/dL (ref 12.0–15.0)
Lymphocytes Relative: 35.3 % (ref 12.0–46.0)
Lymphs Abs: 1.3 K/uL (ref 0.7–4.0)
MCHC: 32.9 g/dL (ref 30.0–36.0)
MCV: 94.9 fl (ref 78.0–100.0)
Monocytes Absolute: 0.4 K/uL (ref 0.1–1.0)
Monocytes Relative: 11.2 % (ref 3.0–12.0)
Neutro Abs: 1.8 K/uL (ref 1.4–7.7)
Neutrophils Relative %: 51.3 % (ref 43.0–77.0)
Platelets: 251 K/uL (ref 150.0–400.0)
RBC: 4.62 Mil/uL (ref 3.87–5.11)
RDW: 13.2 % (ref 11.5–15.5)
WBC: 3.6 K/uL — ABNORMAL LOW (ref 4.0–10.5)

## 2024-02-25 LAB — CORTISOL: Cortisol, Plasma: 9.9 ug/dL

## 2024-02-25 LAB — T3, FREE: T3, Free: 3.1 pg/mL (ref 2.3–4.2)

## 2024-02-25 LAB — TSH: TSH: 0.6 u[IU]/mL (ref 0.35–5.50)

## 2024-02-25 LAB — HEMOGLOBIN A1C: Hgb A1c MFr Bld: 6.4 % (ref 4.6–6.5)

## 2024-02-25 NOTE — Progress Notes (Signed)
   Subjective:    Patient ID: Stephanie Lynch, female    DOB: 06-Apr-1971, 53 y.o.   MRN: [id number]  HPI Obesity- pt reports gaining 5 lbs since May.  BMI now 32.58.  Is worried about Thyroid  being off.  Is worried about insulin  resistance.  Pt denies change in eating habits, is counting calories.  Is very frustrated.  Hypothyroid- ongoing issue.  Currently on Levothyroxine  88mcg daily.  + weight gain, fatigue  Hyperlipidemia- chronic problem.  Last LDL 141.  Not currently on medication.   Review of Systems For ROS see HPI     Objective:   Physical Exam Vitals reviewed.  Constitutional:      General: She is not in acute distress.    Appearance: Normal appearance. She is well-developed. She is obese. She is not ill-appearing.  HENT:     Head: Normocephalic and atraumatic.  Eyes:     Conjunctiva/sclera: Conjunctivae normal.     Pupils: Pupils are equal, round, and reactive to light.  Neck:     Thyroid : No thyromegaly.  Cardiovascular:     Rate and Rhythm: Normal rate and regular rhythm.     Pulses: Normal pulses.     Heart sounds: Normal heart sounds. No murmur heard. Pulmonary:     Effort: Pulmonary effort is normal. No respiratory distress.     Breath sounds: Normal breath sounds.  Abdominal:     General: There is no distension.     Palpations: Abdomen is soft.     Tenderness: There is no abdominal tenderness.  Musculoskeletal:     Cervical back: Normal range of motion and neck supple.     Right lower leg: No edema.     Left lower leg: No edema.  Lymphadenopathy:     Cervical: No cervical adenopathy.  Skin:    General: Skin is warm and dry.  Neurological:     General: No focal deficit present.     Mental Status: She is alert and oriented to person, place, and time.  Psychiatric:        Mood and Affect: Mood normal.        Behavior: Behavior normal.        Thought Content: Thought content normal.           Assessment & Plan:

## 2024-02-25 NOTE — Patient Instructions (Signed)
Schedule your complete physical in 6 months We'll notify you of your lab results and make any changes if needed Continue to work on healthy diet and regular exercise- you can do it!! Call with any questions or concerns Stay Safe!  Stay Healthy! Hang in there!!! 

## 2024-02-27 NOTE — Assessment & Plan Note (Signed)
 Chronic problem.  Currently on Levothyroxine  88mcg daily but c/o weight gain and fatigue.  Check labs.  Adjust meds prn

## 2024-02-27 NOTE — Assessment & Plan Note (Signed)
 Ongoing issue.  Up 5 lbs since May.  Has hx of insulin  resistance, worried about diabetes.  Denies changes to eating habits- is actually counting calories.  No regular exercise.  Stressed need for daily physical activity.  Check labs to r/o metabolic causes such as thyroid .  Will follow.

## 2024-02-27 NOTE — Assessment & Plan Note (Signed)
 Chronic problem.  Last LDL 141 but not currently on medication.  Was oping to control w/ diet and exercise.  Check labs.  Start meds prn.

## 2024-02-28 ENCOUNTER — Ambulatory Visit: Payer: Self-pay | Admitting: Family Medicine

## 2024-02-28 DIAGNOSIS — E063 Autoimmune thyroiditis: Secondary | ICD-10-CM

## 2024-02-28 LAB — INSULIN, RANDOM: Insulin: 42.1 u[IU]/mL — ABNORMAL HIGH

## 2024-02-28 LAB — THYROID ANTIBODIES (THYROPEROXIDASE & THYROGLOBULIN)
Thyroglobulin Ab: <1 [IU]/mL (ref <=1)
Thyroperoxidase Ab SerPl-aCnc: 30 [IU]/mL — ABNORMAL HIGH (ref <9)

## 2024-02-28 MED ORDER — LEVOTHYROXINE SODIUM 88 MCG PO TABS: 88.0000 ug | ORAL_TABLET | Freq: Every day | ORAL | 3 refills | Status: DC

## 2024-02-28 NOTE — Addendum Note (Signed)
 Addended by: Nashley Cordoba E on: 02/28/2024 12:51 PM   Modules accepted: Orders

## 2024-02-28 NOTE — Telephone Encounter (Signed)
 Per willow:  Patient is requesting to have her synthroid  medication refilled but after looking in patient's medication it looks like her synthroid  has not been refilled since 04/08/2023 with a 30 tab 3 refill supply. Thyroid  antibodies were collected as well as the insulin  on 02/25/2024 but they do not look to be resulted yet.    Please advise

## 2024-02-28 NOTE — Telephone Encounter (Signed)
 Patient is requesting to have her synthroid  medication refilled but after looking in patient's medication it looks like her synthroid  has not been refilled since 04/08/2023 with a 30 tab 3 refill supply. Thyroid  antibodies were collected as well as the insulin  on 02/25/2024 but they do not look to be resulted yet.

## 2024-02-28 NOTE — Telephone Encounter (Signed)
-----   Message from Comer Greet sent at 02/28/2024 12:51 PM EDT ----- Your insulin  level is quite high- which indicates diabetes is likely.  Based on this, I'm glad we are restarting Metformin  ----- Message ----- From: Interface, Lab In Three Zero One Sent: 02/25/2024   5:00 PM EDT To: Comer FORBES Greet, MD

## 2024-02-28 NOTE — Telephone Encounter (Signed)
 Patient still has metformin  from previous prescriptions and states no need for a refill yet.

## 2024-04-07 DIAGNOSIS — Z860101 Personal history of adenomatous and serrated colon polyps: Secondary | ICD-10-CM | POA: Diagnosis not present

## 2024-04-07 DIAGNOSIS — K648 Other hemorrhoids: Secondary | ICD-10-CM | POA: Diagnosis not present

## 2024-04-07 DIAGNOSIS — Z1211 Encounter for screening for malignant neoplasm of colon: Secondary | ICD-10-CM | POA: Diagnosis not present

## 2024-04-07 DIAGNOSIS — Z860100 Personal history of colon polyps, unspecified: Secondary | ICD-10-CM | POA: Diagnosis not present

## 2024-04-07 LAB — HM COLONOSCOPY

## 2024-07-11 DIAGNOSIS — E063 Autoimmune thyroiditis: Secondary | ICD-10-CM

## 2024-07-11 MED ORDER — LEVOTHYROXINE SODIUM 88 MCG PO TABS: 88.0000 ug | ORAL_TABLET | Freq: Every day | ORAL | 3 refills | Status: AC

## 2024-08-10 ENCOUNTER — Encounter: Payer: Self-pay | Admitting: Family Medicine

## 2024-08-10 ENCOUNTER — Ambulatory Visit: Admitting: Family Medicine

## 2024-08-10 VITALS — BP 120/78 | HR 83 | Ht 67.0 in | Wt 210.0 lb

## 2024-08-10 DIAGNOSIS — Z1231 Encounter for screening mammogram for malignant neoplasm of breast: Secondary | ICD-10-CM

## 2024-08-10 DIAGNOSIS — R7303 Prediabetes: Secondary | ICD-10-CM

## 2024-08-10 DIAGNOSIS — Z0000 Encounter for general adult medical examination without abnormal findings: Secondary | ICD-10-CM

## 2024-08-10 DIAGNOSIS — E559 Vitamin D deficiency, unspecified: Secondary | ICD-10-CM

## 2024-08-10 LAB — VITAMIN D 25 HYDROXY (VIT D DEFICIENCY, FRACTURES): VITD: 38.54 ng/mL (ref 30.00–100.00)

## 2024-08-10 LAB — CBC WITH DIFFERENTIAL/PLATELET
Basophils Absolute: 0.1 10*3/uL (ref 0.0–0.1)
Basophils Relative: 1.6 % (ref 0.0–3.0)
Eosinophils Absolute: 0.1 10*3/uL (ref 0.0–0.7)
Eosinophils Relative: 1.5 % (ref 0.0–5.0)
HCT: 44.2 % (ref 36.0–46.0)
Hemoglobin: 14.6 g/dL (ref 12.0–15.0)
Lymphocytes Relative: 37.4 % (ref 12.0–46.0)
Lymphs Abs: 1.4 10*3/uL (ref 0.7–4.0)
MCHC: 33.1 g/dL (ref 30.0–36.0)
MCV: 96.2 fl (ref 78.0–100.0)
Monocytes Absolute: 0.4 10*3/uL (ref 0.1–1.0)
Monocytes Relative: 11.2 % (ref 3.0–12.0)
Neutro Abs: 1.8 10*3/uL (ref 1.4–7.7)
Neutrophils Relative %: 48.3 % (ref 43.0–77.0)
Platelets: 248 10*3/uL (ref 150.0–400.0)
RBC: 4.59 Mil/uL (ref 3.87–5.11)
RDW: 13.3 % (ref 11.5–15.5)
WBC: 3.7 10*3/uL — ABNORMAL LOW (ref 4.0–10.5)

## 2024-08-10 LAB — BASIC METABOLIC PANEL WITH GFR
BUN: 15 mg/dL (ref 6–23)
CO2: 32 meq/L (ref 19–32)
Calcium: 9.6 mg/dL (ref 8.4–10.5)
Chloride: 104 meq/L (ref 96–112)
Creatinine, Ser: 0.7 mg/dL (ref 0.40–1.20)
GFR: 99.52 mL/min
Glucose, Bld: 92 mg/dL (ref 70–99)
Potassium: 4.9 meq/L (ref 3.5–5.1)
Sodium: 141 meq/L (ref 135–145)

## 2024-08-10 LAB — LIPID PANEL
Cholesterol: 206 mg/dL — ABNORMAL HIGH (ref 28–200)
HDL: 76 mg/dL
LDL Cholesterol: 115 mg/dL — ABNORMAL HIGH (ref 10–99)
NonHDL: 130.14
Total CHOL/HDL Ratio: 3
Triglycerides: 75 mg/dL (ref 10.0–149.0)
VLDL: 15 mg/dL (ref 0.0–40.0)

## 2024-08-10 LAB — B12 AND FOLATE PANEL
Folate: 10.5 ng/mL
Vitamin B-12: 395 pg/mL (ref 211–911)

## 2024-08-10 LAB — TSH: TSH: 0.68 u[IU]/mL (ref 0.35–5.50)

## 2024-08-10 LAB — HEPATIC FUNCTION PANEL
ALT: 12 U/L (ref 3–35)
AST: 14 U/L (ref 5–37)
Albumin: 4.7 g/dL (ref 3.5–5.2)
Alkaline Phosphatase: 95 U/L (ref 39–117)
Bilirubin, Direct: 0.1 mg/dL (ref 0.1–0.3)
Total Bilirubin: 0.5 mg/dL (ref 0.2–1.2)
Total Protein: 7.4 g/dL (ref 6.0–8.3)

## 2024-08-10 LAB — HEMOGLOBIN A1C: Hgb A1c MFr Bld: 5.9 % (ref 4.6–6.5)

## 2024-08-10 NOTE — Patient Instructions (Signed)
 Follow up in 6 months to recheck sugar and weight loss We'll notify you of your lab results and make any changes if needed Continue to work on healthy diet and regular exercise- you can do it! We'll call you to schedule your mammogram Call with any questions or concerns Stay Safe!  Stay Healthy! Happy Valentine's Day!

## 2024-08-10 NOTE — Assessment & Plan Note (Signed)
 Pt's PE WNL w/ exception of BMI.  UTD on pap, colonoscopy.  Mammo ordered.  She declines vaccines.  Check labs.  Anticipatory guidance provided.

## 2024-08-10 NOTE — Progress Notes (Signed)
" ° °  Subjective:    Patient ID: Stephanie Lynch, female    DOB: 1971-04-10, 54 y.o.   MRN: [id number]  HPI CPE- due for mammo.  UTD on pap, colonoscopy.  Declines vaccines.  Health Maintenance  Topic Date Due   Hepatitis B Vaccines 19-59 Average Risk (1 of 3 - 19+ 3-dose series) Never done   Mammogram  05/19/2024   Influenza Vaccine  10/03/2024 (Originally 02/04/2024)   Zoster Vaccines- Shingrix (1 of 2) 11/07/2024 (Originally 04/21/2022)   DTaP/Tdap/Td (2 - Td or Tdap) 08/10/2025 (Originally 09/30/2023)   Pneumococcal Vaccine: 50+ Years (1 of 1 - PCV) 08/10/2025 (Originally 04/21/2022)   Cervical Cancer Screening (HPV/Pap Cotest)  05/25/2026   Colonoscopy  04/07/2034   HPV VACCINES (No Doses Required) Completed   Meningococcal B Vaccine  Aged Out   COVID-19 Vaccine  Discontinued   Hepatitis C Screening  Discontinued   HIV Screening  Discontinued    Patient Care Team    Relationship Specialty Notifications Start End  Mahlon Comer BRAVO, MD PCP - General Family Medicine  07/28/22   Gorge Ade, MD (Inactive) Consulting Physician Obstetrics and Gynecology  07/27/22   Kristie Lamprey, MD Consulting Physician Gastroenterology  07/27/22       Review of Systems Patient reports no vision/ hearing changes, adenopathy,fever, weight change,  persistant/recurrent hoarseness , swallowing issues, chest pain, palpitations, edema, persistant/recurrent cough, hemoptysis, dyspnea (rest/exertional/paroxysmal nocturnal), gastrointestinal bleeding (melena, rectal bleeding), abdominal pain, significant heartburn, bowel changes, GU symptoms (dysuria, hematuria, incontinence), Gyn symptoms (abnormal  bleeding, pain),  syncope, focal weakness, memory loss, numbness & tingling, skin/hair/nail changes, abnormal bruising or bleeding, anxiety, or depression.     Objective:   Physical Exam General Appearance:    Alert, cooperative, no distress, appears stated age, obese  Head:    Normocephalic, without obvious  abnormality, atraumatic  Eyes:    PERRL, conjunctiva/corneas clear, EOM's intact both eyes  Ears:    Normal TM's and external ear canals, both ears  Nose:   Nares normal, septum midline, mucosa normal, no drainage    or sinus tenderness  Throat:   Lips, mucosa, and tongue normal; teeth and gums normal  Neck:   Supple, symmetrical, trachea midline, no adenopathy;    Thyroid : no enlargement/tenderness/nodules  Back:     Symmetric, no curvature, ROM normal, no CVA tenderness  Lungs:     Clear to auscultation bilaterally, respirations unlabored  Chest Wall:    No tenderness or deformity   Heart:    Regular rate and rhythm, S1 and S2 normal, no murmur, rub   or gallop  Breast Exam:    Deferred to mammo  Abdomen:     Soft, non-tender, bowel sounds active all four quadrants,    no masses, no organomegaly  Genitalia:    Deferred  Rectal:    Extremities:   Extremities normal, atraumatic, no cyanosis or edema  Pulses:   2+ and symmetric all extremities  Skin:   Skin color, texture, turgor normal, no rashes or lesions  Lymph nodes:   Cervical, supraclavicular, and axillary nodes normal  Neurologic:   CNII-XII intact, normal strength, sensation and reflexes    throughout          Assessment & Plan:    "

## 2024-08-11 ENCOUNTER — Ambulatory Visit: Payer: Self-pay | Admitting: Family Medicine

## 2024-08-11 ENCOUNTER — Encounter: Admitting: Family Medicine

## 2024-08-11 MED ORDER — METFORMIN HCL 500 MG PO TABS: 500.0000 mg | ORAL_TABLET | Freq: Two times a day (BID) | ORAL | 0 refills | Status: AC

## 2024-08-11 NOTE — Telephone Encounter (Signed)
 Okay to refill metformin ? I just saw that it was filled by another provider.

## 2024-08-11 NOTE — Telephone Encounter (Signed)
 Okay to refill Vit D? I can refill Metformin  for patient
